# Patient Record
Sex: Male | Born: 1935 | Race: White | Hispanic: No | Marital: Married | Smoking: Never smoker
Health system: Southern US, Community
[De-identification: ages and names within clinical notes are randomized; demographics above are authoritative.]

## PROBLEM LIST (undated history)

## (undated) DIAGNOSIS — I219 Acute myocardial infarction, unspecified: Secondary | ICD-10-CM

## (undated) DIAGNOSIS — D638 Anemia in other chronic diseases classified elsewhere: Secondary | ICD-10-CM

## (undated) DIAGNOSIS — E079 Disorder of thyroid, unspecified: Secondary | ICD-10-CM

## (undated) DIAGNOSIS — C801 Malignant (primary) neoplasm, unspecified: Secondary | ICD-10-CM

## (undated) DIAGNOSIS — I10 Essential (primary) hypertension: Secondary | ICD-10-CM

## (undated) DIAGNOSIS — K2970 Gastritis, unspecified, without bleeding: Secondary | ICD-10-CM

## (undated) DIAGNOSIS — E119 Type 2 diabetes mellitus without complications: Secondary | ICD-10-CM

## (undated) DIAGNOSIS — F0280 Dementia in other diseases classified elsewhere without behavioral disturbance: Secondary | ICD-10-CM

## (undated) DIAGNOSIS — E785 Hyperlipidemia, unspecified: Secondary | ICD-10-CM

## (undated) DIAGNOSIS — H269 Unspecified cataract: Secondary | ICD-10-CM

## (undated) DIAGNOSIS — G309 Alzheimer's disease, unspecified: Secondary | ICD-10-CM

## (undated) DIAGNOSIS — N183 Chronic kidney disease, stage 3 (moderate): Secondary | ICD-10-CM

## (undated) HISTORY — PX: APPENDECTOMY: SHX54

## (undated) HISTORY — DX: Essential (primary) hypertension: I10

## (undated) HISTORY — DX: Malignant (primary) neoplasm, unspecified: C80.1

## (undated) HISTORY — DX: Hyperlipidemia, unspecified: E78.5

## (undated) HISTORY — DX: Unspecified cataract: H26.9

## (undated) HISTORY — DX: Type 2 diabetes mellitus without complications: E11.9

## (undated) HISTORY — DX: Anemia in other chronic diseases classified elsewhere: D63.8

## (undated) HISTORY — DX: Disorder of thyroid, unspecified: E07.9

## (undated) HISTORY — DX: Acute myocardial infarction, unspecified: I21.9

## (undated) HISTORY — PX: EYE SURGERY: SHX253

## (undated) HISTORY — DX: Dementia in other diseases classified elsewhere, unspecified severity, without behavioral disturbance, psychotic disturbance, mood disturbance, and anxiety: F02.80

## (undated) HISTORY — DX: Chronic kidney disease, stage 3 (moderate): N18.3

## (undated) HISTORY — DX: Alzheimer's disease, unspecified: G30.9

---

## 2017-03-17 ENCOUNTER — Encounter: Payer: Self-pay | Admitting: Family Medicine

## 2017-03-17 ENCOUNTER — Ambulatory Visit (INDEPENDENT_AMBULATORY_CARE_PROVIDER_SITE_OTHER): Payer: Medicare Other | Admitting: Family Medicine

## 2017-03-17 ENCOUNTER — Encounter: Payer: Medicare Other | Admitting: Family Medicine

## 2017-03-17 ENCOUNTER — Other Ambulatory Visit: Payer: Self-pay

## 2017-03-17 VITALS — BP 140/68 | HR 94 | Temp 97.4°F | Resp 14 | Ht 63.0 in | Wt 144.8 lb

## 2017-03-17 DIAGNOSIS — G301 Alzheimer's disease with late onset: Secondary | ICD-10-CM

## 2017-03-17 DIAGNOSIS — E559 Vitamin D deficiency, unspecified: Secondary | ICD-10-CM

## 2017-03-17 DIAGNOSIS — F0280 Dementia in other diseases classified elsewhere without behavioral disturbance: Secondary | ICD-10-CM | POA: Diagnosis not present

## 2017-03-17 DIAGNOSIS — C44300 Unspecified malignant neoplasm of skin of unspecified part of face: Secondary | ICD-10-CM | POA: Diagnosis not present

## 2017-03-17 DIAGNOSIS — E785 Hyperlipidemia, unspecified: Secondary | ICD-10-CM

## 2017-03-17 DIAGNOSIS — N183 Chronic kidney disease, stage 3 unspecified: Secondary | ICD-10-CM

## 2017-03-17 DIAGNOSIS — E039 Hypothyroidism, unspecified: Secondary | ICD-10-CM | POA: Diagnosis not present

## 2017-03-17 DIAGNOSIS — E119 Type 2 diabetes mellitus without complications: Secondary | ICD-10-CM | POA: Diagnosis not present

## 2017-03-17 DIAGNOSIS — R269 Unspecified abnormalities of gait and mobility: Secondary | ICD-10-CM

## 2017-03-17 DIAGNOSIS — R296 Repeated falls: Secondary | ICD-10-CM

## 2017-03-17 DIAGNOSIS — IMO0002 Reserved for concepts with insufficient information to code with codable children: Secondary | ICD-10-CM

## 2017-03-17 DIAGNOSIS — D638 Anemia in other chronic diseases classified elsewhere: Secondary | ICD-10-CM | POA: Diagnosis not present

## 2017-03-17 DIAGNOSIS — E538 Deficiency of other specified B group vitamins: Secondary | ICD-10-CM | POA: Diagnosis not present

## 2017-03-17 DIAGNOSIS — R808 Other proteinuria: Secondary | ICD-10-CM

## 2017-03-17 DIAGNOSIS — G309 Alzheimer's disease, unspecified: Secondary | ICD-10-CM

## 2017-03-17 DIAGNOSIS — I10 Essential (primary) hypertension: Secondary | ICD-10-CM | POA: Insufficient documentation

## 2017-03-17 DIAGNOSIS — E1165 Type 2 diabetes mellitus with hyperglycemia: Secondary | ICD-10-CM

## 2017-03-17 DIAGNOSIS — E118 Type 2 diabetes mellitus with unspecified complications: Secondary | ICD-10-CM

## 2017-03-17 LAB — GLUCOSE, POCT (MANUAL RESULT ENTRY): POC Glucose: 267 mg/dl — AB (ref 70–99)

## 2017-03-17 NOTE — Patient Instructions (Addendum)
Please look at the new medication list I have recommended discontinuing several medicines. Baby aspirin needs to be taken every day. Do not start the Lasix at this time Do not need vitamin E or fenofibrate  Need lab work today I will notify you of your lab test results I may recommend other medication changes once I have seen the test results  Have placed an urgent referral to dermatology I will place referrals to cardiology or endocrinology depending on his test results and need  I recommended that Jonathon Miller not drive a car for the time being.  Daily walking, with supervision, to tolerance is important to keep leg strength  Return in 1-2 weeks for follow-up with Jonathon Miller (40 minutes)  Need old records from [place] [facility] and endocrinology. (Last 2 years only)  Give Imodium AD over-the-counter 1 pill every morning.  Give a second pill if the diarrhea persists longer than 1 hour.  I recommend getting a fresh bottle. Drink plenty of water.

## 2017-03-17 NOTE — Progress Notes (Addendum)
Chief Complaint  Patient presents with  . Diarrhea  . Diabetes  . Fall   This is a lovely 84 year old man who is brought in by his daughter.  He and his wife [name] have just moved to the area from [place] 1 week ago.  They moved here because [name] was unable to continue to care for him with advancing dementia.  He also has some weakness, gait disturbance, and falls. History is difficult because Dreshaun is both hard of hearing and has moderate dementia.  His wife does not seem to know all the answers.  The daughter has just gotten them 1 week ago and will help fill in some.  I am going to have to get the old records to complete his chart.  His medical problem list was made from the medicine box they brought with them. Rameses has had a gradual memory loss over many years.  A week ago, in [place], he was still driving.  He knew the roads, went short distances, and felt comfortable doing.  His daughter Juliann Pulse was not comfortable.  After MMSE testing today, with a score of 19, I informed Levy that I do not think he should be driving.  Especially since they moved to a new area he does not know his way around.  He has not had any memory testing, neurology consultation, or medicines for Alzheimer's. He has diabetes.  Well controlled by history.  He takes oral medications.  It is questionable if he has been taking his medicines correctly since he has been responsible for setting up his pillbox and taking his medicines until very recently.  He sometimes forgets if he had his pills and mixes up his pills in the pill box.  No known visual impairment, kidney disease, neuropathy or numbness. He has hyperlipidemia and is on a statin. He has hypertension and is on atenolol.  Uncertain why he is not on an ACE or ARB medication. He states that he had a heart attack in 1979.  He has not had one since.  He is uncertain if he is been followed by cardiologist.  Uncertain cardiology testing.  He is uncertain if he is  ever had stents.  He is certain he has not had any heart surgery. He thinks he has had the pneumonia shots, uncertain when.  He states he always gets yearly flu shot.  Shingles shot "sounds familiar".  Unknown last tetanus. As I interview him I realize he has a lesion on his lip.  It is obviously a basal cell carcinoma.  She states this was diagnosed as a cancer in [place], and they were advised to get it fixed up here.  Patient Active Problem List   Diagnosis Date Noted  . Diabetes mellitus without complication ([place]) 81/01/7508  . Alzheimer's dementia without behavioral disturbance 03/17/2017  . Hypothyroidism 03/17/2017  . Hypertension 03/17/2017  . Hyperlipidemia 03/17/2017  . Gait disturbance 03/17/2017  . Falls frequently 03/17/2017  . Vitamin D deficiency, unspecified 03/17/2017  . Vitamin B12 deficiency (non anemic) 03/17/2017    Outpatient Encounter Medications as of 03/17/2017  Medication Sig  . atenolol (TENORMIN) 50 MG tablet Take 50 mg by mouth daily.  [name] glipiZIDE (GLUCOTROL) 5 MG tablet Take by mouth daily before breakfast.  . levothyroxine (SYNTHROID) 50 MCG tablet Take 50 mcg by mouth daily before breakfast.  . linagliptin (TRADJENTA) 5 MG TABS tablet Take 5 mg by mouth daily.  . nitroGLYCERIN (NITROSTAT) 0.4 MG SL tablet Place 0.4 mg under  the tongue every 5 (five) minutes as needed for chest pain.  . pioglitazone (ACTOS) 15 MG tablet Take 15 mg by mouth daily.  . simvastatin (ZOCOR) 40 MG tablet Take 40 mg by mouth daily.   No facility-administered encounter medications on file as of 03/17/2017.     Past Medical History:  Diagnosis Date  . Alzheimer's dementia   . Cancer (HCC)    Skin  . Cataract    skin  . Diabetes mellitus without complication ([place])   . Hyperlipidemia   . Hypertension   . Myocardial infarction ([place])   . Thyroid disease     Past Surgical History:  Procedure Laterality Date  . APPENDECTOMY    . EYE SURGERY      Social History    Socioeconomic History  . Marital status: Married    Spouse name: [name]  . Number of children: 3  . Years of education: 70  . Highest education level: High school graduate  Social Needs  . Financial resource strain: Not hard at all  . Food insecurity - worry: Never true  . Food insecurity - inability: Never true  . Transportation needs - medical: No  . Transportation needs - non-medical: No  Occupational History  . 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 Occupation: Child psychotherapist  . 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 Smoking status: Never Smoker  . Smokeless tobacco: Never Used  Substance and Sexual Activity  . Alcohol use: No    Frequency: Never  . Drug use: No  . Sexual activity: No  Other Topics Concern  . Not on file  Social History Narrative   Just moved to [place] with wife [name], wife of 24 years (second)   Lives with Daughter [name] who helps care for them   Likes to Golf-not able anymore   Deer Hunting-not able anymore   Prior occupation was baking/cooking    Family History  Problem Relation Age of Onset  . Diabetes Other     Review of Systems  Constitutional: Negative for chills, fever, malaise/fatigue and weight loss.       More difficulty with strength in legs  HENT: Positive for hearing loss. Negative for congestion and sinus pain.        Did not bring his hearing aids, and obviously hard of hearing  Eyes: Negative for blurred vision and double vision.  Respiratory: Negative for cough and shortness of breath.   Cardiovascular: Negative for chest pain, palpitations and leg swelling.       Carries nitro that he never uses  Gastrointestinal: Positive for diarrhea. Negative for abdominal pain and blood in stool.       Chronic diarrhea for many years.  "Runs in family".  Worse since moving to [place].  Genitourinary: Negative for dysuria and frequency.  Musculoskeletal: Positive for falls. Negative for joint pain and myalgias.   Skin:       Lesion on upper lip  Neurological: Positive for weakness and headaches. Negative for dizziness.  Psychiatric/Behavioral: Positive for depression and memory loss.    BP 140/68 (BP Location: Right Arm, Patient Position: Sitting, Cuff Size: Normal)   Pulse 94   Temp (!) 97.4 F (36.3 C)   Resp 14   Ht 5\' 3"  (1.6 m)   Wt 144 lb 12 oz (65.7 kg)   SpO2 97%   BMI 25.64 kg/m   Physical Exam  Constitutional: He is oriented to person, place, and time.  He appears well-developed and well-nourished. No distress.  Small steps.  Requires assistance getting out of chair.  Person assist to walk  HENT:  Head: Normocephalic and atraumatic.  Right Ear: External ear normal.  Left Ear: External ear normal.  Mouth/Throat: Oropharynx is clear and moist.    Well hydrated.  Dentures fit  Eyes: Pupils are equal, round, and reactive to light.  Arcus senilis  Neck: Normal range of motion. Neck supple. No JVD present. No thyromegaly present.  No bruit  Cardiovascular: Normal rate, regular rhythm and normal heart sounds.  Pulmonary/Chest: Effort normal and breath sounds normal. He has no wheezes. He has no rales.  Abdominal: Soft. Bowel sounds are normal. He exhibits no distension.  No organomegaly.  Normal bowel sounds.  Nontender.  No guarding rebound  Musculoskeletal: Normal range of motion. He exhibits no edema.  Diminished strength quadriceps and hip flexors.  Otherwise normal strength sensation range of motion and reflexes  Neurological: He is alert and oriented to person, place, and time. He displays normal reflexes. Coordination abnormal.  Skin: Skin is warm and dry.  Psychiatric:  Quiet demeanor.  Likes to joke.  Obvious memory impairment.Normal mood and affect.  Vitals reviewed.  MMSE - Mini Mental State Exam 03/17/2017  Orientation to time 3  Orientation to Place 2  Registration 1  Attention/ Calculation 5  Recall 0  Language- name 2 objects 2  Language- repeat 1   Language- follow 3 step command 3  Language- read & follow direction 1  Write a sentence 1  Copy design 0  Total score 19   Results for orders placed or performed in visit on 03/17/17  POCT Glucose (CBG)  Result Value Ref Range   POC Glucose 267 (A) 70 - 99 mg/dl   ASSESSMENT/PLAN:   1. Gait disturbance Recent per family over the last couple of months.  Increased difficulty with ambulation, requiring assistance.  Trouble getting out of chair.  2. Falls frequently No falls [place] but fell in [place] several times.  No injuries.  Patient is certain he has not fainted, he says he just loses his balance.  3. Essential hypertension Well-controlled.  Would likely change him to an ACE or ARB  4. Hypothyroidism, unspecified type On replacement - TSH  5. Diabetes mellitus without complication ([place]) Well-controlled by history.  No complications by history.  Need updated blood work and old records. - CBC with Differential/Platelet - COMPLETE METABOLIC PANEL WITH GFR - Hemoglobin A1c - Urinalysis, Routine w reflex microscopic - Microalbumin / creatinine urine ratio  6. Late onset Alzheimer's disease without behavioral disturbance With an MMSE of 19 I recommend that he not drive.  I recommend that the daughter attend Alzheimer's caregiver classes.  We need to understand that he does not make new memories while on that moving is going to be difficult for him.  The need to take over his medication distribution.  I have discontinued several medicines I do not feel like he needs.  Daily exercise, to his tolerance, is advisable.  7. Hyperlipidemia, unspecified hyperlipidemia type On medication  - Lipid panel  8. Vitamin D deficiency, unspecified By history - VITAMIN D 25 Hydroxy (Vit-D Deficiency, Fractures)  9. Vitamin B12 deficiency (non anemic) By history - Vitamin B12  10. Cancer of skin of face Referred - Ambulatory referral to Dermatology   Patient Instructions   Please look at the new medication list I have recommended discontinuing several medicines. Baby aspirin needs to be taken every  day. Do not start the Lasix at this time Do not need vitamin E or fenofibrate  Need lab work today I will notify you of your lab test results I may recommend other medication changes once I have seen the test results  Have placed an urgent referral to dermatology I will place referrals to cardiology or endocrinology depending on his test results and need  I recommended that Skyy not drive a car for the time being.  Daily walking, with supervision, to tolerance is important to keep leg strength  Return in 1-2 weeks for follow-up with Dr. Mannie Stabile (40 minutes)  Need old records from [place] [facility] and endocr[facility]ology. (Last 2 years only)  Give Imodium AD over-the-counter 1 pill every morning.  Give a second pill if the diarrhea persists longer than 1 hour.  I recommend getting a fresh bottle. Drink plenty of water.   Raylene Everts, MD

## 2017-03-18 ENCOUNTER — Encounter: Payer: Self-pay | Admitting: Family Medicine

## 2017-03-18 DIAGNOSIS — E1122 Type 2 diabetes mellitus with diabetic chronic kidney disease: Secondary | ICD-10-CM | POA: Insufficient documentation

## 2017-03-18 DIAGNOSIS — D638 Anemia in other chronic diseases classified elsewhere: Secondary | ICD-10-CM | POA: Insufficient documentation

## 2017-03-18 DIAGNOSIS — N183 Chronic kidney disease, stage 3 unspecified: Secondary | ICD-10-CM | POA: Insufficient documentation

## 2017-03-18 DIAGNOSIS — R809 Proteinuria, unspecified: Secondary | ICD-10-CM | POA: Insufficient documentation

## 2017-03-18 HISTORY — DX: Anemia in other chronic diseases classified elsewhere: D63.8

## 2017-03-18 HISTORY — DX: Chronic kidney disease, stage 3 unspecified: N18.30

## 2017-03-18 LAB — LIPID PANEL
CHOL/HDL RATIO: 4.4 (calc) (ref <5.0)
Cholesterol: 179 mg/dL (ref <200)
HDL: 41 mg/dL (ref >40)
LDL CHOLESTEROL (CALC): 107 mg/dL — AB
NON-HDL CHOLESTEROL (CALC): 138 mg/dL — AB (ref <130)
TRIGLYCERIDES: 185 mg/dL — AB (ref <150)

## 2017-03-18 LAB — CBC WITH DIFFERENTIAL/PLATELET
BASOS ABS: 29 {cells}/uL (ref 0–200)
Basophils Relative: 0.3 %
Eosinophils Absolute: 10 cells/uL — ABNORMAL LOW (ref 15–500)
Eosinophils Relative: 0.1 %
HEMATOCRIT: 33.9 % — AB (ref 38.5–50.0)
Hemoglobin: 11.5 g/dL — ABNORMAL LOW (ref 13.2–17.1)
Lymphs Abs: 686 cells/uL — ABNORMAL LOW (ref 850–3900)
MCH: 29.9 pg (ref 27.0–33.0)
MCHC: 33.9 g/dL (ref 32.0–36.0)
MCV: 88.1 fL (ref 80.0–100.0)
MONOS PCT: 3.4 %
MPV: 10.4 fL (ref 7.5–12.5)
NEUTROS PCT: 89.2 %
Neutro Abs: 8742 cells/uL — ABNORMAL HIGH (ref 1500–7800)
PLATELETS: 235 10*3/uL (ref 140–400)
RBC: 3.85 10*6/uL — ABNORMAL LOW (ref 4.20–5.80)
RDW: 12.7 % (ref 11.0–15.0)
TOTAL LYMPHOCYTE: 7 %
WBC mixed population: 333 cells/uL (ref 200–950)
WBC: 9.8 10*3/uL (ref 3.8–10.8)

## 2017-03-18 LAB — COMPLETE METABOLIC PANEL WITH GFR
AG RATIO: 1.9 (calc) (ref 1.0–2.5)
ALT: 14 U/L (ref 9–46)
AST: 19 U/L (ref 10–35)
Albumin: 4.4 g/dL (ref 3.6–5.1)
Alkaline phosphatase (APISO): 50 U/L (ref 40–115)
BILIRUBIN TOTAL: 0.4 mg/dL (ref 0.2–1.2)
BUN/Creatinine Ratio: 18 (calc) (ref 6–22)
BUN: 29 mg/dL — ABNORMAL HIGH (ref 7–25)
CHLORIDE: 99 mmol/L (ref 98–110)
CO2: 27 mmol/L (ref 20–32)
Calcium: 9.7 mg/dL (ref 8.6–10.3)
Creat: 1.59 mg/dL — ABNORMAL HIGH (ref 0.70–1.11)
GFR, EST AFRICAN AMERICAN: 44 mL/min/{1.73_m2} — AB (ref >=60)
GFR, Est Non African American: 38 mL/min/{1.73_m2} — ABNORMAL LOW (ref >=60)
GLOBULIN: 2.3 g/dL (ref 1.9–3.7)
Glucose, Bld: 296 mg/dL — ABNORMAL HIGH (ref 65–139)
POTASSIUM: 5.1 mmol/L (ref 3.5–5.3)
SODIUM: 135 mmol/L (ref 135–146)
TOTAL PROTEIN: 6.7 g/dL (ref 6.1–8.1)

## 2017-03-18 LAB — URINALYSIS, ROUTINE W REFLEX MICROSCOPIC
BACTERIA UA: NONE SEEN /HPF
Bilirubin Urine: NEGATIVE
HYALINE CAST: NONE SEEN /LPF
Hgb urine dipstick: NEGATIVE
Ketones, ur: NEGATIVE
Leukocytes, UA: NEGATIVE
Nitrite: NEGATIVE
RBC / HPF: NONE SEEN /HPF (ref 0–2)
SPECIFIC GRAVITY, URINE: 1.023 (ref 1.001–1.03)
Squamous Epithelial / LPF: NONE SEEN /HPF (ref <=5)
WBC UA: NONE SEEN /HPF (ref 0–5)

## 2017-03-18 LAB — MICROALBUMIN / CREATININE URINE RATIO
Creatinine, Urine: 84 mg/dL (ref 20–320)
MICROALB UR: 22.1 mg/dL
MICROALB/CREAT RATIO: 263 ug/mg{creat} — AB (ref <30)

## 2017-03-18 LAB — HEMOGLOBIN A1C
EAG (MMOL/L): 12.5 (calc)
Hgb A1c MFr Bld: 9.5 % of total Hgb — ABNORMAL HIGH (ref <5.7)
Mean Plasma Glucose: 226 (calc)

## 2017-03-18 LAB — TSH: TSH: 1.76 m[IU]/L (ref 0.40–4.50)

## 2017-03-18 LAB — VITAMIN B12: VITAMIN B 12: 503 pg/mL (ref 200–1100)

## 2017-03-18 LAB — VITAMIN D 25 HYDROXY (VIT D DEFICIENCY, FRACTURES): VIT D 25 HYDROXY: 50 ng/mL (ref 30–100)

## 2017-03-19 ENCOUNTER — Encounter: Payer: Self-pay | Admitting: Family Medicine

## 2017-03-30 ENCOUNTER — Ambulatory Visit (INDEPENDENT_AMBULATORY_CARE_PROVIDER_SITE_OTHER): Payer: Medicare Other | Admitting: Family Medicine

## 2017-03-30 ENCOUNTER — Encounter: Payer: Self-pay | Admitting: Family Medicine

## 2017-03-30 ENCOUNTER — Other Ambulatory Visit: Payer: Self-pay

## 2017-03-30 VITALS — BP 114/60 | HR 50 | Temp 97.9°F | Resp 16 | Ht 63.0 in | Wt 147.0 lb

## 2017-03-30 DIAGNOSIS — IMO0002 Reserved for concepts with insufficient information to code with codable children: Secondary | ICD-10-CM

## 2017-03-30 DIAGNOSIS — E785 Hyperlipidemia, unspecified: Secondary | ICD-10-CM | POA: Diagnosis not present

## 2017-03-30 DIAGNOSIS — I10 Essential (primary) hypertension: Secondary | ICD-10-CM | POA: Diagnosis not present

## 2017-03-30 DIAGNOSIS — E118 Type 2 diabetes mellitus with unspecified complications: Secondary | ICD-10-CM

## 2017-03-30 DIAGNOSIS — E1165 Type 2 diabetes mellitus with hyperglycemia: Secondary | ICD-10-CM

## 2017-03-30 DIAGNOSIS — R001 Bradycardia, unspecified: Secondary | ICD-10-CM

## 2017-03-30 DIAGNOSIS — H6122 Impacted cerumen, left ear: Secondary | ICD-10-CM | POA: Diagnosis not present

## 2017-03-30 DIAGNOSIS — F0280 Dementia in other diseases classified elsewhere without behavioral disturbance: Secondary | ICD-10-CM | POA: Diagnosis not present

## 2017-03-30 DIAGNOSIS — C4491 Basal cell carcinoma of skin, unspecified: Secondary | ICD-10-CM | POA: Diagnosis not present

## 2017-03-30 DIAGNOSIS — R5383 Other fatigue: Secondary | ICD-10-CM

## 2017-03-30 DIAGNOSIS — G301 Alzheimer's disease with late onset: Secondary | ICD-10-CM | POA: Diagnosis not present

## 2017-03-30 DIAGNOSIS — R2681 Unsteadiness on feet: Secondary | ICD-10-CM

## 2017-03-30 DIAGNOSIS — R269 Unspecified abnormalities of gait and mobility: Secondary | ICD-10-CM | POA: Diagnosis not present

## 2017-03-30 NOTE — Progress Notes (Signed)
Patient ID: Jonathon Miller, male    DOB: 22-Dec-1925, 84 y.o.   MRN: [id number]  Chief Complaint  Patient presents with  . Follow-up    Allergies Penicillins  Subjective:   Jonathon Miller is a 84 y.o. male who presents to [facility] today.  HPI Mr. Hinde is an 84 year old elderly gentleman who presents today with his wife and stepdaughter for office visit to establish care.  He moved from [place] approximately 1 month ago with his wife.  He has a history of heart attack, hypothyroidism, diabetes mellitus type 2, and dementia.  Has had issues with his memory and given the diagnosis of dementia greater than 5 years ago.  He had requested to not be on any medications and he did not want evaluation of his dementia in the past.  He has not had any imaging of his brain or formal evaluation of his memory.  He deferred medications because he did not trust the side effects or trust the medications.  He does continue to have a slow decline in his memory and no acute changes.  He was seen by Dr. Meda Coffee less than a month ago and his Mini-Mental status test was 19 out of 30.  He is no longer driving.  He functions well at home and taking care of his ADLs.  He has not had any recent falls since moving to [place].   In February, 2019 prior to his move from [place] he was seen by dermatologist for a lesion on the skin above the border of his right upper lip.  They bring in the pathology today from the shave biopsy which revealed a nodular basal cell carcinoma.  It was recommended that he follow-up with a dermatologist in this area for Mohs surgery.  They are requesting a referral.  His daughter and wife report that they are a little bit concerned because he seems more fatigued than usual over the past several weeks.  They report that he sleeps on and often naps throughout the day but still does sleep well at night.  He reports that he is not down, depressed, or hopeless.  He does  report some feelings of sadness with leaving [place] and his golfing friends.  He did not golf but still hung out and socialized with this group of friends.  His appetite is good.  Is not crying.  Denies any suicidal or homicidal ideations.  Has not been able to get outside and be as active because the weather has been poor for the past month since moving here.  Is planning on having a garden soon.  He has not had any falls since moving here but reports a history of an unsteady gait with several falls in [place].  He does walk the dog on a daily basis.  He has not had any acute changes in his gait.  He does not do any other daily exercise or muscle strengthening other than walking the dog if the weather is good.  It is a small dog and if there is never been any associated falls with this exercise.  Family is inquiring about ways to help with his balance.  He does wear hearing aids due to hearing loss.  He does wear the hearing aids at times when he walks the dog but not every time.  Denies any nausea, vomiting, difficulty urinating.  Reports that 2 weeks ago had headaches for several days which was abnormal, but has not had any subsequent  headaches.  Vision is stable.  Medications have previously been administered by patient and his wife.  Daughter recently took over administering medications.  When he was last seen in our office on February 20/2019 by Dr. Meda Coffee his hemoglobin A1c was elevated greater than 9%, TSH was within normal limits, electrolytes were normal,  LDL was greater than 100, creatinine was consistent with chronic renal insufficiency.  Urinalysis did reveal proteinuria.  Since his last visit and this information was relayed to his daughter.  She reports that he has been getting his medications on a daily basis.  He did take his atenolol this morning.    Past Medical History:  Diagnosis Date  . Alzheimer's dementia   . Anemia of chronic disease 03/18/2017  . Cancer (HCC)    Skin  .  Cataract    skin  . CRD (chronic renal disease), stage III ([place]) 03/18/2017  . Diabetes mellitus without complication ([place])   . Hyperlipidemia   . Hypertension   . Myocardial infarction ([place])   . Thyroid disease     Past Surgical History:  Procedure Laterality Date  . APPENDECTOMY    . EYE SURGERY      Family History  Problem Relation Age of Onset  . Diabetes Other      Social History   Socioeconomic History  . Marital status: Married    Spouse name: [name]  . Number of children: 3  . Years of education: 29  . Highest education level: High school graduate  Social Needs  . Financial resource strain: Not hard at all  . Food insecurity - worry: Never true  . Food insecurity - inability: Never true  . Transportation needs - medical: No  . Transportation needs - non-medical: No  Occupational History  . 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 Occupation: Child psychotherapist  . 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 Smoking status: Never Smoker  . Smokeless tobacco: Never Used  Substance and Sexual Activity  . Alcohol use: No    Frequency: Never  . Drug use: No  . Sexual activity: No  Other Topics Concern  . None  Social History Narrative   Just moved to [place] with wife [name], wife of 24 years (second)   Lives with Daughter [name] who helps care for them   Likes to Golf-not able anymore   Deer Hunting-not able anymore   Prior occupation was baking/cooking      Current Outpatient Medications on File Prior to Visit  Medication Sig Dispense Refill  . aspirin EC 81 MG tablet Take 81 mg by mouth daily.    [name] glipiZIDE (GLUCOTROL) 5 MG tablet Take by mouth daily before breakfast.    . levothyroxine (SYNTHROID) 50 MCG tablet Take 50 mcg by mouth daily before breakfast.    . linagliptin (TRADJENTA) 5 MG TABS tablet Take 5 mg by mouth daily.    . nitroGLYCERIN (NITROSTAT) 0.4 MG SL tablet Place 0.4 mg under the tongue every 5 (five) minutes as needed for chest  pain.    . pioglitazone (ACTOS) 15 MG tablet Take 15 mg by mouth daily.    . vitamin C (ASCORBIC ACID) 500 MG tablet Take 500 mg by mouth daily.     No current facility-administered medications on file prior to visit.     Review of Systems  Constitutional: Positive for fatigue. Negative for appetite change, chills, diaphoresis and unexpected weight change.  HENT: Positive for hearing loss. Negative  for sore throat, tinnitus, trouble swallowing and voice change.        Feels like over the past several weeks that his hearing is gotten worse in his left ear.  Of note he is not wearing the hearing aid in his left ear today.  Eyes: Negative for visual disturbance.  Respiratory: Negative for cough, chest tightness, shortness of breath and wheezing.   Cardiovascular: Negative for chest pain, palpitations and leg swelling.  Gastrointestinal: Negative for abdominal pain, rectal pain and vomiting.       Patient and family report that he has episodes of diarrhea long-term.  They report that for years he will have intermittent episodes of diarrhea which can occur every several days.  Denies any melena, bright red blood per rectum.  Denies any abdominal pain.  Reports that the diarrhea/loose her stools are his "regular" bowel movements.  Genitourinary: Negative for decreased urine volume, difficulty urinating, dysuria and hematuria.  Musculoskeletal: Negative for back pain, myalgias, neck pain and neck stiffness.  Skin: Negative for rash.  Neurological: Negative for dizziness, tremors, syncope, light-headedness, numbness and headaches.       He specifically denies symptoms of dizziness or vertigo.  He reports that he just does not feel as steady on his feet and like he might fall.  Hematological: Negative for adenopathy. Does not bruise/bleed easily.  Psychiatric/Behavioral: Positive for sleep disturbance. Negative for agitation, behavioral problems, decreased concentration and dysphoric mood. The patient  is not nervous/anxious.      Objective:   BP 114/60 (BP Location: Left Arm, Patient Position: Sitting, Cuff Size: Normal)   Pulse (!) 50   Temp 97.9 F (36.6 C) (Temporal)   Resp 16   Ht 5\' 3"  (1.6 m)   Wt 147 lb (66.7 kg)   SpO2 98%   BMI 26.04 kg/m  Pulse recheck 46 Physical Exam  Constitutional: He appears well-developed and well-nourished. No distress.  Well-developed, well-nourished elderly gentleman.  Appearance consistent with his stated age.  Alert.  Very good at deflecting questions to which require attention to the past or remote memory.  HENT:  Head: Normocephalic and atraumatic.  Right Ear: External ear normal.  Nose: Nose normal.  Mouth/Throat: Oropharynx is clear and moist. No oropharyngeal exudate.  Left tympanic membrane unable to be visualized secondary to cerumen obstruction.  Eyes: Conjunctivae and EOM are normal. Pupils are equal, round, and reactive to light. No scleral icterus.  Neck: Normal range of motion. Neck supple. No JVD present. No tracheal deviation present. No thyromegaly present.  Cardiovascular: Regular rhythm and normal heart sounds. Bradycardia present.  Pulmonary/Chest: Effort normal and breath sounds normal. No respiratory distress.  Abdominal: Soft. Bowel sounds are normal. He exhibits no distension. There is no tenderness.  Lymphadenopathy:    He has no cervical adenopathy.  Neurological: He is alert. He has normal strength. No cranial nerve deficit or sensory deficit. Gait normal.  Gait appears normal.  No wide-based stance or abnormalities.  Muscle strength 5 out of 5 throughout.  Grip strength strong.  Patient does have some difficulty giving straightforward answers to questions and review of systems.  He is unable to tell me if he has muscle aches or pains.  Skin: He is not diaphoretic.     Assessment and Plan  1. Impacted cerumen of left ear Refer to ENT for cerumen removal/follow-up audiology evaluation.  Defer ear lavage  secondary to patient's gait instability and fall risk.  Believe that this could further exacerbate his condition.  Need  to establish with ENT secondary to hearing loss anyway. - Ambulatory referral to ENT  2. Nodular basal cell carcinoma Referral placed to dermatology for surgical removal of basal cell. - Ambulatory referral to Dermatology  3. Gait instability Refer to physical therapy for evaluation and treatment to assist with balance therapy/gait stability. - Ambulatory referral to Physical Therapy  4. Hyperlipidemia, unspecified hyperlipidemia type Discontinue medication at this time secondary to side effects and patient does not want to take any additional medication that is  Not absolutely required. Family wishes to d/c at this time the statin. Understands risk vs. Benefit of medication.   5. Gait disturbance See above.  6. Late onset Alzheimer's disease without behavioral disturbance At this time, labs reviewed and discussed with family.  Defer any imaging test or further workup at this time.  Continue to assist and support as needed.  7. Uncontrolled type 2 diabetes mellitus with complication (HCC) Continue medications as directed.  Did discuss with daughter that I do not recommend increasing medications at this time due to the fact that he has not been compliant in the past.  They can continue to monitor sporadic blood sugars at home.  They report they have been checking fasting levels for years and they have come down over the past several weeks.  Continue dietary monitoring.  Recommend bring diet record for review.  Plan to recheck A1c in 3 months.  8. Essential hypertension/now with symptoms of bradycardia. Unsure if patient is on beta-blocker secondary to history of hypertension versus secondary to acute myocardial infarction.  Patient current with bradycardia.  DC atenolol at this time. Discussed with daughter and patient today that his pulse was 46.  Will DC atenolol at this  time and they will call back tomorrow with his pulse reading and depending reading will further dictate management. 9.  Hypothyroidism. Continue medication.  TSH within normal limits.  10.   Fatigue Fatigue possibly due to multiple etiologies.  At this time suspect fatigue secondary to bradycardia.  Discussed with family and patient that he could have some mild mood disorder at this time secondary to moving that could be causing his fatigue.  Defer medications at this time.  He does not have overt signs of depression.  Fatigue could be secondary to anemia, but I suspect that this is been a chronic process related to his chronic disease.  Possibly check ferritin levels and erythropoietin levels at follow-up.  Need to discuss with family and patient the degree to which they would like to investigate and evaluate these conditions. Return in about 2 weeks (around 04/13/2017) for Heart rate. [name], MD 03/30/2017

## 2017-04-01 ENCOUNTER — Telehealth: Payer: Self-pay | Admitting: Family Medicine

## 2017-04-01 NOTE — Telephone Encounter (Signed)
Please call and advise to still hold the medication and let me know on Monday how his pulse runs over the weekend.  Gwen Her. Mannie Stabile, MD

## 2017-04-01 NOTE — Telephone Encounter (Signed)
Daughter, Lorenza Evangelist, left message on nurse line stating patient's pulse yesterday was 74 and 54 today

## 2017-04-01 NOTE — Telephone Encounter (Signed)
Spoke to daughter, informed

## 2017-04-15 ENCOUNTER — Telehealth: Payer: Self-pay | Admitting: Family Medicine

## 2017-04-21 ENCOUNTER — Telehealth (HOSPITAL_COMMUNITY): Payer: Self-pay | Admitting: Family Medicine

## 2017-04-21 ENCOUNTER — Encounter: Payer: Self-pay | Admitting: Family Medicine

## 2017-04-21 ENCOUNTER — Other Ambulatory Visit: Payer: Self-pay

## 2017-04-21 ENCOUNTER — Ambulatory Visit (INDEPENDENT_AMBULATORY_CARE_PROVIDER_SITE_OTHER): Payer: Medicare Other | Admitting: Family Medicine

## 2017-04-21 VITALS — BP 132/64 | HR 93 | Temp 97.1°F | Resp 16 | Ht 63.0 in | Wt 146.8 lb

## 2017-04-21 DIAGNOSIS — R21 Rash and other nonspecific skin eruption: Secondary | ICD-10-CM | POA: Diagnosis not present

## 2017-04-21 DIAGNOSIS — R001 Bradycardia, unspecified: Secondary | ICD-10-CM | POA: Diagnosis not present

## 2017-04-21 DIAGNOSIS — F0390 Unspecified dementia without behavioral disturbance: Secondary | ICD-10-CM

## 2017-04-21 DIAGNOSIS — R2681 Unsteadiness on feet: Secondary | ICD-10-CM | POA: Diagnosis not present

## 2017-04-21 DIAGNOSIS — G3281 Cerebellar ataxia in diseases classified elsewhere: Secondary | ICD-10-CM | POA: Diagnosis not present

## 2017-04-21 DIAGNOSIS — G309 Alzheimer's disease, unspecified: Secondary | ICD-10-CM | POA: Diagnosis not present

## 2017-04-21 DIAGNOSIS — L821 Other seborrheic keratosis: Secondary | ICD-10-CM | POA: Diagnosis not present

## 2017-04-21 DIAGNOSIS — Z23 Encounter for immunization: Secondary | ICD-10-CM

## 2017-04-21 NOTE — Telephone Encounter (Signed)
04/21/17  wife left a message to [facility] said that patient would be receiving treatment in the home

## 2017-04-21 NOTE — Progress Notes (Signed)
Patient ID: Jonathon Miller, male    DOB: 1925-08-18, 84 y.o.   MRN: [id number]  Chief Complaint  Patient presents with  . Follow-up    Allergies Penicillins  Subjective:   Jonathon Miller is a 84 y.o. male who presents to [facility] today.  HPI Jonathon Miller presents today for follow-up visit.  He is accompanied by his wife and his stepdaughter.  He reports that he has been feeling better.  They report that since discontinuing the beta-blocker that his energy is better.  He is not sleeping as much during the day.  He has been getting out in the yard and doing activities.  They feel like his mood is somewhat improved.  They report that they believe that he had an episode of hypoglycemia after working in the yard.  He did skip a meal and continue to work in the yard and they report that he seemed shaky and was not as responsive.  They gave him some sugar and then he was within normal limits.  He has not had any low blood sugars that they have checked.  They did not check his sugar during this incident.  His daily sugar checks have been ranging from 180-200.  Since discontinuation of the beta-blocker his Pulse is running 60-80.  They report that they did not hear back regarding the ENT referral.  In addition he was set up for physical therapy at the outpatient rehab facility here in town.  They report that they would like it switched to home health since he is unable to drive and his wife is unable to drive.  He does not drive secondary to his dementia.  In addition he does not leave home alone secondary to his dementia.  His stepdaughter reports that he has never wanted any workup or evaluation for his dementia.  She is wondering if he should do that now.  He still reports in the office today that he does not believe he has dementia and he is not interested in any treatment or workup.  His wife and daughter reports that at this time they will continue to agree with his wishes.  They do  question whether he would benefit from any dementia medications.  He has never had an MRI or any imaging of his brain.  He denies any chest pain, shortness of breath, or swelling in his extremities.  His bowel movements are good.  Appetite is good.  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  They did not take the pathology results with them and could not find them.  He is scheduled to follow back up with him with the results.  They do report that he is always itching and scratching his back.  Has been doing this for quite some time even before he moved to [place].  Has been going on for several months.  No lesions or bites.  No history of bedbugs.  Also reports that on his anterior aspect of the right shoulder that he has a rash that had been itching him.  It has scabbed over at this time but they were concerned about it.  He is not work in the yard with his shirt off.    Past Medical History:  Diagnosis Date  . Alzheimer's dementia   . Anemia of chronic disease 03/18/2017  . Cancer (HCC)    Skin  . Cataract    skin  .  CRD (chronic renal disease), stage III ([place]) 03/18/2017  . Diabetes mellitus without complication ([place])   . Hyperlipidemia   . Hypertension   . Myocardial infarction ([place])   . Thyroid disease     Past Surgical History:  Procedure Laterality Date  . APPENDECTOMY    . EYE SURGERY      Family History  Problem Relation Age of Onset  . Diabetes Other      Social History   Socioeconomic History  . Marital status: Married    Spouse name: [name]  . Number of children: 3  . Years of education: 85  . Highest education level: High school graduate  Occupational History  . 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 Occupation: Child psychotherapist  . 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 Financial resource strain: Not hard at all  . Food insecurity:    Worry: Never true    Inability: Never true  .  Transportation needs:    Medical: No    Non-medical: No  Tobacco Use  . Smoking status: Never Smoker  . Smokeless tobacco: Never Used  Substance and Sexual Activity  . Alcohol use: No    Frequency: Never  . Drug use: No  . Sexual activity: Never  Lifestyle  . Physical activity:    Days per week: 0 days    Minutes per session: 0 min  . Stress: To some extent  Relationships  . Social connections:    Talks on phone: Once a week    Gets together: More than three times a week    Attends religious service: Never    Active member of club or organization: No    Attends meetings of clubs or organizations: Never    Relationship status: Married  Other Topics Concern  . Not on file  Social History Narrative   Just moved to [place] with wife [name], wife of 24 years (second)   Lives with Daughter [name] who helps care for them   Likes to Golf-not able anymore   Deer Hunting-not able anymore   Prior occupation was baking/cooking      Current Outpatient Medications on File Prior to Visit  Medication Sig Dispense Refill  . aspirin EC 81 MG tablet Take 81 mg by mouth daily.    [name] glipiZIDE (GLUCOTROL) 5 MG tablet Take by mouth daily before breakfast.    . levothyroxine (SYNTHROID) 50 MCG tablet Take 50 mcg by mouth daily before breakfast.    . linagliptin (TRADJENTA) 5 MG TABS tablet Take 5 mg by mouth daily.    . nitroGLYCERIN (NITROSTAT) 0.4 MG SL tablet Place 0.4 mg under the tongue every 5 (five) minutes as needed for chest pain.    . pioglitazone (ACTOS) 15 MG tablet Take 15 mg by mouth daily.    . vitamin C (ASCORBIC ACID) 500 MG tablet Take 500 mg by mouth daily.     No current facility-administered medications on file prior to visit.     Review of Systems  Constitutional: Negative for appetite change, chills, fever and unexpected weight change.  HENT: Negative for ear discharge, ear pain, sinus pressure, sore throat, trouble swallowing and voice change.   Eyes:  Negative for visual disturbance.  Respiratory: Negative for cough, chest tightness, shortness of breath and wheezing.   Cardiovascular: Negative for chest pain, palpitations and leg swelling.  Gastrointestinal: Negative for abdominal pain, diarrhea, nausea and vomiting.  Genitourinary: Negative for decreased urine volume, dysuria and  frequency.  Musculoskeletal: Positive for gait problem.       Reports that since coming off the beta-blocker he does not feel quite as dizzy.  He does still have some dizziness and instability with his gait per his daughter.  She reports that when he goes up the stairs she is sometimes concerned that he will fall backwards.  They have put up handrails that he is supposed to use when going up the stairs.  He does not use any assistive devices at home.  He has not had a fall since moving to [place].  Skin: Negative for rash.  Neurological: Negative for dizziness, tremors, syncope, facial asymmetry, weakness and headaches.  Hematological: Negative for adenopathy. Does not bruise/bleed easily.  Psychiatric/Behavioral:       He reports that his mood is good.  He denies any suicidal or homicidal ideations.  He is sleeping well.  Family reports no behavioral disturbances.    Current Outpatient Medications on File Prior to Visit  Medication Sig Dispense Refill  . aspirin EC 81 MG tablet Take 81 mg by mouth daily.    [name] glipiZIDE (GLUCOTROL) 5 MG tablet Take by mouth daily before breakfast.    . levothyroxine (SYNTHROID) 50 MCG tablet Take 50 mcg by mouth daily before breakfast.    . linagliptin (TRADJENTA) 5 MG TABS tablet Take 5 mg by mouth daily.    . nitroGLYCERIN (NITROSTAT) 0.4 MG SL tablet Place 0.4 mg under the tongue every 5 (five) minutes as needed for chest pain.    . pioglitazone (ACTOS) 15 MG tablet Take 15 mg by mouth daily.    . vitamin C (ASCORBIC ACID) 500 MG tablet Take 500 mg by mouth daily.     No current facility-administered medications on file  prior to visit.     Objective:   BP 132/64 (BP Location: Left Arm, Patient Position: Sitting, Cuff Size: Normal)   Pulse 93   Temp (!) 97.1 F (36.2 C) (Temporal)   Resp 16   Ht 5\' 3"  (1.6 m)   Wt 146 lb 12 oz (66.6 kg)   SpO2 97%   BMI 26.00 kg/m   Physical Exam  Constitutional: He appears well-developed and well-nourished.  Neck: Normal range of motion. Neck supple.  Cardiovascular: Normal rate, regular rhythm, normal heart sounds and intact distal pulses.  Pulmonary/Chest: Effort normal and breath sounds normal.  Abdominal: Soft. Bowel sounds are normal.  Skin: Skin is warm and dry. Capillary refill takes less than 2 seconds. Rash noted.  Patient does have multiple seborrheic keratosis on the back with evidence of superficial excoriation.  Evidence of linear rash on right shoulder approximate 13 cm in length.  Scabbed area.  No active lesions.  No vesicles.  No associated erythema.  Psychiatric: He has a normal mood and affect. His behavior is normal.  Vitals reviewed.    Assessment and Plan  1. Bradycardia Bradycardia has subsequently resolved after discontinuation of beta-blocker.  Patient has had increased energy level and decreased daytime somnolence.  We will continue to monitor heart rate. 2. Gait instability/weakness Patient with history of gait instability.  Referral for home health for physical therapy placed today.  Patient is unable to leave the home by himself or drive.  Home health for strengthening and fall risk/safety evaluation - Ambulatory referral to [facility]  3. Immunization due  - Pneumococcal conjugate vaccine 13-valent IM-vaccination deferred - Td : Tetanus/diphtheria >7yo Preservative  Free-vaccination given  4. Seborrheic keratoses Discussed with patient  and family that I suspect that the seborrheic keratosis are causing him pruritus.  Would recommend lotion to the back as needed.  Patient has upcoming follow-up with dermatology.  Will get  evaluation at this time  5. Dementia without behavioral disturbance, unspecified dementia type Stepdaughter and wife had questions regarding patient's dementia.  He has been experiencing memory disturbance for many years but has always refused imaging studies and medication and laboratory evaluation.  I did discuss with him that the only other recommended labs other than what we have done would be HIV and syphilis.  He would also be considered to treat him for some mild depression which could have occurred upon moving to this area and leaving friends back in [place] and [place].  Patient is not interested in laboratory testing or imaging studies.  He is not interested in getting lab work today.  I did discuss with family that it would be beneficial to have an MRI prior to starting medications because medications used to treat dementia are specific to certain types of dementia and not necessarily vascular dementia, which she is at risk for.  Family would like to discuss in further detail at next visit and defers labs at this time.  I have placed a lab order and if they change their mind they will let me know.  This discussion was had at the end of the visit they reported that they had changed their mind and would like to proceed with the MRI.  Patient was agreeable with this.  Therefore, we will go ahead and place MRI. - HIV antibody - RPR  6. Rash/skin eruption Area in question is now healed and scabbing over.  This skin eruption is in a specific dermatomal distribution.  We did discuss that this could have been a shingles outbreak or multiple other etiologies.  At this time there is no indication for treatment.  Will call if patient develops any subsequent rashes or problems.  Return in about 1 month (around 05/19/2017) for Follow-up. [name], MD 04/22/2017

## 2017-04-26 ENCOUNTER — Ambulatory Visit (HOSPITAL_COMMUNITY): Payer: Medicare Other

## 2017-05-03 ENCOUNTER — Telehealth: Payer: Self-pay

## 2017-05-03 ENCOUNTER — Telehealth: Payer: Self-pay | Admitting: Family Medicine

## 2017-05-03 NOTE — Telephone Encounter (Signed)
Wants to discuss the rashes on his body and the fact that his glipizide was prescribed for once daily and he used to be on twice daily [phone]

## 2017-05-03 NOTE — Telephone Encounter (Signed)
Patients daughter stopped by the office for mychart questions and to turn in a proxy application per mychart help desk.  She also wants to know if patient needs to take GLIPIZIDE 5mg , 2x daily due to high blood sugar. Cb#: [phone]

## 2017-05-04 NOTE — Telephone Encounter (Signed)
 I believe this is now your patient. Please advise.  Thank you.  S.

## 2017-05-05 MED ORDER — GLIPIZIDE ER 5 MG PO TB24: 5.0000 mg | ORAL_TABLET | Freq: Every day | ORAL | 1 refills | Status: DC

## 2017-05-05 NOTE — Telephone Encounter (Signed)
Please see the other message in the chart. Please advise patient that we did discuss the rash as his last visit and he is going to follow up with his dermatology. You may call daughter-in-law and give her this information. Also, please do confirm that they have a copy of his biopsy results. Gwen Her. Mannie Stabile, MD

## 2017-05-05 NOTE — Telephone Encounter (Signed)
Daughter informed

## 2017-05-05 NOTE — Telephone Encounter (Signed)
I will call in the xl formulation that he can take once a day. Please advise sent to pharmacy.

## 2017-05-08 ENCOUNTER — Emergency Department (HOSPITAL_COMMUNITY): Payer: Medicare Other

## 2017-05-08 ENCOUNTER — Emergency Department (HOSPITAL_COMMUNITY)
Admission: EM | Admit: 2017-05-08 | Discharge: 2017-05-08 | Disposition: A | Discharge status: Home / Self Care | Payer: Medicare Other | Attending: Emergency Medicine | Admitting: Emergency Medicine

## 2017-05-08 ENCOUNTER — Other Ambulatory Visit: Payer: Self-pay

## 2017-05-08 ENCOUNTER — Encounter (HOSPITAL_COMMUNITY): Payer: Self-pay | Admitting: Emergency Medicine

## 2017-05-08 DIAGNOSIS — G309 Alzheimer's disease, unspecified: Secondary | ICD-10-CM | POA: Diagnosis not present

## 2017-05-08 DIAGNOSIS — E039 Hypothyroidism, unspecified: Secondary | ICD-10-CM | POA: Insufficient documentation

## 2017-05-08 DIAGNOSIS — R112 Nausea with vomiting, unspecified: Secondary | ICD-10-CM

## 2017-05-08 DIAGNOSIS — Z7984 Long term (current) use of oral hypoglycemic drugs: Secondary | ICD-10-CM | POA: Insufficient documentation

## 2017-05-08 DIAGNOSIS — R1084 Generalized abdominal pain: Secondary | ICD-10-CM | POA: Diagnosis not present

## 2017-05-08 DIAGNOSIS — I129 Hypertensive chronic kidney disease with stage 1 through stage 4 chronic kidney disease, or unspecified chronic kidney disease: Secondary | ICD-10-CM | POA: Diagnosis not present

## 2017-05-08 DIAGNOSIS — R197 Diarrhea, unspecified: Secondary | ICD-10-CM

## 2017-05-08 DIAGNOSIS — Z85828 Personal history of other malignant neoplasm of skin: Secondary | ICD-10-CM | POA: Insufficient documentation

## 2017-05-08 DIAGNOSIS — Z7982 Long term (current) use of aspirin: Secondary | ICD-10-CM | POA: Insufficient documentation

## 2017-05-08 DIAGNOSIS — F0280 Dementia in other diseases classified elsewhere without behavioral disturbance: Secondary | ICD-10-CM | POA: Insufficient documentation

## 2017-05-08 DIAGNOSIS — E1122 Type 2 diabetes mellitus with diabetic chronic kidney disease: Secondary | ICD-10-CM | POA: Diagnosis not present

## 2017-05-08 DIAGNOSIS — N183 Chronic kidney disease, stage 3 (moderate): Secondary | ICD-10-CM | POA: Insufficient documentation

## 2017-05-08 DIAGNOSIS — R1900 Intra-abdominal and pelvic swelling, mass and lump, unspecified site: Secondary | ICD-10-CM | POA: Diagnosis not present

## 2017-05-08 DIAGNOSIS — R109 Unspecified abdominal pain: Secondary | ICD-10-CM | POA: Diagnosis present

## 2017-05-08 LAB — CBC WITH DIFFERENTIAL/PLATELET
Basophils Absolute: 0 10*3/uL (ref 0.0–0.1)
Basophils Relative: 0 %
EOS ABS: 0 10*3/uL (ref 0.0–0.7)
EOS PCT: 0 %
HCT: 36.8 % — ABNORMAL LOW (ref 39.0–52.0)
Hemoglobin: 12 g/dL — ABNORMAL LOW (ref 13.0–17.0)
LYMPHS ABS: 0.7 10*3/uL (ref 0.7–4.0)
Lymphocytes Relative: 7 %
MCH: 29.3 pg (ref 26.0–34.0)
MCHC: 32.6 g/dL (ref 30.0–36.0)
MCV: 90 fL (ref 78.0–100.0)
MONO ABS: 0.9 10*3/uL (ref 0.1–1.0)
Monocytes Relative: 9 %
Neutro Abs: 8.1 10*3/uL — ABNORMAL HIGH (ref 1.7–7.7)
Neutrophils Relative %: 84 %
PLATELETS: 219 10*3/uL (ref 150–400)
RBC: 4.09 MIL/uL — AB (ref 4.22–5.81)
RDW: 12.7 % (ref 11.5–15.5)
WBC: 9.7 10*3/uL (ref 4.0–10.5)

## 2017-05-08 LAB — URINALYSIS, ROUTINE W REFLEX MICROSCOPIC
BACTERIA UA: NONE SEEN
BILIRUBIN URINE: NEGATIVE
Glucose, UA: NEGATIVE mg/dL
Ketones, ur: 5 mg/dL — AB
LEUKOCYTES UA: NEGATIVE
Nitrite: NEGATIVE
PROTEIN: 30 mg/dL — AB
Specific Gravity, Urine: 1.018 (ref 1.005–1.030)
pH: 5 (ref 5.0–8.0)

## 2017-05-08 LAB — COMPREHENSIVE METABOLIC PANEL
ALT: 17 U/L (ref 17–63)
ANION GAP: 14 (ref 5–15)
AST: 21 U/L (ref 15–41)
Albumin: 3.9 g/dL (ref 3.5–5.0)
Alkaline Phosphatase: 48 U/L (ref 38–126)
BUN: 24 mg/dL — ABNORMAL HIGH (ref 6–20)
CHLORIDE: 96 mmol/L — AB (ref 101–111)
CO2: 23 mmol/L (ref 22–32)
Calcium: 9.7 mg/dL (ref 8.9–10.3)
Creatinine, Ser: 1.33 mg/dL — ABNORMAL HIGH (ref 0.61–1.24)
GFR calc non Af Amer: 45 mL/min — ABNORMAL LOW (ref >60)
GFR, EST AFRICAN AMERICAN: 53 mL/min — AB (ref >60)
Glucose, Bld: 180 mg/dL — ABNORMAL HIGH (ref 65–99)
POTASSIUM: 3.8 mmol/L (ref 3.5–5.1)
Sodium: 133 mmol/L — ABNORMAL LOW (ref 135–145)
Total Bilirubin: 0.8 mg/dL (ref 0.3–1.2)
Total Protein: 7.1 g/dL (ref 6.5–8.1)

## 2017-05-08 LAB — LIPASE, BLOOD: Lipase: 39 U/L (ref 11–51)

## 2017-05-08 LAB — TROPONIN I

## 2017-05-08 MED ORDER — ONDANSETRON HCL 4 MG/2ML IJ SOLN
4.0000 mg | INTRAMUSCULAR | Status: DC | PRN
  Administered 2017-05-08: 4 mg via INTRAVENOUS
  Filled 2017-05-08: qty 2

## 2017-05-08 MED ORDER — SODIUM CHLORIDE 0.9 % IV SOLN
INTRAVENOUS | Status: DC
  Administered 2017-05-08: 100 mL/h via INTRAVENOUS

## 2017-05-08 MED ORDER — FAMOTIDINE 20 MG PO TABS: 20.0000 mg | ORAL_TABLET | Freq: Two times a day (BID) | ORAL | 0 refills | Status: AC

## 2017-05-08 MED ORDER — SODIUM CHLORIDE 0.9 % IV BOLUS
250.0000 mL | Freq: Once | INTRAVENOUS | Status: AC
Start: 2017-05-08 — End: 2017-05-08
  Administered 2017-05-08: 250 mL via INTRAVENOUS

## 2017-05-08 MED ORDER — FAMOTIDINE IN NACL 20-0.9 MG/50ML-% IV SOLN
20.0000 mg | Freq: Once | INTRAVENOUS | Status: AC
  Administered 2017-05-08: 20 mg via INTRAVENOUS
  Filled 2017-05-08: qty 50

## 2017-05-08 MED ORDER — ONDANSETRON 4 MG PO TBDP: 4.0000 mg | ORAL_TABLET | Freq: Three times a day (TID) | ORAL | 0 refills | Status: DC | PRN

## 2017-05-08 NOTE — Discharge Instructions (Signed)
An incidental finding was seen on your CT scan:  "Soft tissue density mass within the lower most portion of the posterior pelvis, posterior to the lower rectum, appearing to be separate from the rectum, measuring 3.7 x 2.2 cm, of uncertain etiology or significance, most likely chronic." Take the prescriptions as directed.  Increase your fluid intake (ie:  Gatoraide) for the next few days.  Eat a bland diet and advance to your regular diet slowly as you can tolerate it.   Avoid full strength juices, as well as milk and milk products until your diarrhea has resolved.   Call your regular medical doctor Monday to schedule a follow up appointment this week to follow up the above finding.  Return to the Emergency Department immediately if not improving (or even worsening) despite taking the medicines as prescribed, any black or bloody stool or vomit, if you develop a fever over "101," or for any other concerns.

## 2017-05-08 NOTE — ED Triage Notes (Signed)
Patient complaining of abdominal pain x 3 days. States vomited x 1, no diarrhea.

## 2017-05-08 NOTE — ED Notes (Signed)
Pt given water about 30 minutes ago. Pt tolerating PO fluids well.

## 2017-05-08 NOTE — ED Provider Notes (Signed)
[facility] EMERGENCY DEPARTMENT Provider Note   CSN: [id number] Arrival date & time: 05/08/17  7824     History   Chief Complaint Chief Complaint  Patient presents with  . Abdominal Pain    HPI Jonathon Miller is a 84 y.o. male.  The history is provided by the patient, the spouse and a relative. The history is limited by the condition of the patient (Hx dementia).  Abdominal Pain    Pt was seen at 0735. Per pt and his family, c/o gradual onset and persistence of multiple intermittent episodes of N/V/D that began 3 days ago.  Describes the stools as "watery." Has been associated with generalized abd "pain." Denies CP/SOB, no back pain, no fevers, no black or blood in stools or emesis.    Past Medical History:  Diagnosis Date  . Alzheimer's dementia   . Anemia of chronic disease 03/18/2017  . Cancer (HCC)    Skin  . Cataract    skin  . CRD (chronic renal disease), stage III ([place]) 03/18/2017  . Diabetes mellitus without complication ([place])   . Hyperlipidemia   . Hypertension   . Myocardial infarction ([place])   . Thyroid disease     Patient Active Problem List   Diagnosis Date Noted  . Nodular basal cell carcinoma 03/30/2017  . Fatigue 03/30/2017  . Bradycardia 03/30/2017  . Proteinuria 03/18/2017  . CRD (chronic renal disease), stage III ([place]) 03/18/2017  . Anemia of chronic disease 03/18/2017  . Uncontrolled type 2 diabetes mellitus with complication ([place]) 23/53/6144  . Alzheimer's dementia without behavioral disturbance 03/17/2017  . Hypothyroidism 03/17/2017  . Hypertension 03/17/2017  . Hyperlipidemia 03/17/2017  . Gait disturbance 03/17/2017  . Falls frequently 03/17/2017    Past Surgical History:  Procedure Laterality Date  . APPENDECTOMY    . EYE SURGERY          Home Medications    Prior to Admission medications   Medication Sig Start Date End Date Taking? Authorizing Provider  aspirin EC 81 MG tablet Take 81 mg by mouth daily.    [provider]  glipiZIDE (GLUCOTROL XL) 5 MG 24 hr tablet Take 1 tablet (5 mg total) by mouth daily with breakfast. 05/05/17   [name], MD  levothyroxine (SYNTHROID) 50 MCG tablet Take 50 mcg by mouth daily before breakfast.    [provider]  linagliptin (TRADJENTA) 5 MG TABS tablet Take 5 mg by mouth daily.    [provider]  nitroGLYCERIN (NITROSTAT) 0.4 MG SL tablet Place 0.4 mg under the tongue every 5 (five) minutes as needed for chest pain.    [provider]  pioglitazone (ACTOS) 15 MG tablet Take 15 mg by mouth daily.    [provider]  vitamin C (ASCORBIC ACID) 500 MG tablet Take 500 mg by mouth daily.    [provider]    Family History Family History  Problem Relation Age of Onset  . Diabetes Other     Social History Social History   Tobacco Use  . Smoking status: Never Smoker  . Smokeless tobacco: Never Used  Substance Use Topics  . Alcohol use: No    Frequency: Never  . Drug use: No     Allergies   Penicillins   Review of Systems Review of Systems  Unable to perform ROS: Dementia  Gastrointestinal: Positive for abdominal pain.     Physical Exam Updated Vital Signs BP (!) 175/88   Pulse 96   Temp (!)  97.5 F (36.4 C) (Oral)   Resp 18   Ht 5\' 3"  (1.6 m)   Wt 66.2 kg (146 lb)   SpO2 96%   BMI 25.86 kg/m    BP 138/74   Pulse 79   Temp (!) 97.5 F (36.4 C) (Oral)   Resp 20   Ht 5\' 3"  (1.6 m)   Wt 66.2 kg (146 lb)   SpO2 96%   BMI 25.86 kg/m    Physical Exam 0740: Physical examination:  Nursing notes reviewed; Vital signs and O2 SAT reviewed;  Constitutional: Well developed, Well nourished, In no acute distress; Head:  Normocephalic, atraumatic; Eyes: EOMI, PERRL, No scleral icterus; ENMT: Mouth and pharynx normal, Mucous membranes dry; Neck: Supple, Full range of motion, No lymphadenopathy; Cardiovascular: Regular rate and rhythm, No gallop; Respiratory: Breath sounds clear & equal  bilaterally, No wheezes.  Speaking full sentences with ease, Normal respiratory effort/excursion; Chest: Nontender, Movement normal; Abdomen: Soft, +diffuse tenderness to palp. No rebound or guarding. Nondistended, Normal bowel sounds; Genitourinary: No CVA tenderness; Extremities: Peripheral pulses normal, No tenderness, No edema, No calf edema or asymmetry.; Neuro: Awake, alert, confused per hx dementia. No facial droop. Speech clear. No gross focal motor or sensory deficits in extremities.; Skin: Color normal, Warm, Dry.   ED Treatments / Results  Labs (all labs ordered are listed, but only abnormal results are displayed)   EKG EKG Interpretation  Date/Time:  Saturday May 08 2017 07:51:06 EDT Ventricular Rate:  94 PR Interval:    QRS Duration: 92 QT Interval:  355 QTC Calculation: 444 R Axis:   52 Text Interpretation:  Sinus rhythm Low voltage, precordial leads No old tracing to compare Confirmed by Francine Graven [phone]) on 05/08/2017 8:01:00 AM   Radiology   Procedures Procedures (including critical care time)  Medications Ordered in ED Medications  ondansetron (ZOFRAN) injection 4 mg (has no administration in time range)  famotidine (PEPCID) IVPB 20 mg premix (has no administration in time range)     Initial Impression / Assessment and Plan / ED Course  I have reviewed the triage vital signs and the nursing notes.  Pertinent labs & imaging results that were available during my care of the patient were reviewed by me and considered in my medical decision making (see chart for details).  MDM Reviewed: previous chart, nursing note and vitals Reviewed previous: labs and ECG Interpretation: labs, ECG, x-ray and CT scan   Results for orders placed or performed during the hospital encounter of 05/08/17  Lipase, blood  Result Value Ref Range   Lipase 39 11 - 51 U/L  Comprehensive metabolic panel  Result Value Ref Range   Sodium 133 (L) 135 - 145 mmol/L    Potassium 3.8 3.5 - 5.1 mmol/L   Chloride 96 (L) 101 - 111 mmol/L   CO2 23 22 - 32 mmol/L   Glucose, Bld 180 (H) 65 - 99 mg/dL   BUN 24 (H) 6 - 20 mg/dL   Creatinine, Ser 1.33 (H) 0.61 - 1.24 mg/dL   Calcium 9.7 8.9 - 10.3 mg/dL   Total Protein 7.1 6.5 - 8.1 g/dL   Albumin 3.9 3.5 - 5.0 g/dL   AST 21 15 - 41 U/L   ALT 17 17 - 63 U/L   Alkaline Phosphatase 48 38 - 126 U/L   Total Bilirubin 0.8 0.3 - 1.2 mg/dL   GFR calc non Af Amer 45 (L) >60 mL/min   GFR calc Af Amer 53 (L) >60 mL/min  Anion gap 14 5 - 15  Urinalysis, Routine w reflex microscopic  Result Value Ref Range   Color, Urine YELLOW YELLOW   APPearance CLEAR CLEAR   Specific Gravity, Urine 1.018 1.005 - 1.030   pH 5.0 5.0 - 8.0   Glucose, UA NEGATIVE NEGATIVE mg/dL   Hgb urine dipstick SMALL (A) NEGATIVE   Bilirubin Urine NEGATIVE NEGATIVE   Ketones, ur 5 (A) NEGATIVE mg/dL   Protein, ur 30 (A) NEGATIVE mg/dL   Nitrite NEGATIVE NEGATIVE   Leukocytes, UA NEGATIVE NEGATIVE   RBC / HPF 0-5 0 - 5 RBC/hpf   WBC, UA 0-5 0 - 5 WBC/hpf   Bacteria, UA NONE SEEN NONE SEEN   Squamous Epithelial / LPF 0-5 (A) NONE SEEN   Mucus PRESENT   CBC with Differential  Result Value Ref Range   WBC 9.7 4.0 - 10.5 K/uL   RBC 4.09 (L) 4.22 - 5.81 MIL/uL   Hemoglobin 12.0 (L) 13.0 - 17.0 g/dL   HCT 36.8 (L) 39.0 - 52.0 %   MCV 90.0 78.0 - 100.0 fL   MCH 29.3 26.0 - 34.0 pg   MCHC 32.6 30.0 - 36.0 g/dL   RDW 12.7 11.5 - 15.5 %   Platelets 219 150 - 400 K/uL   Neutrophils Relative % 84 %   Neutro Abs 8.1 (H) 1.7 - 7.7 K/uL   Lymphocytes Relative 7 %   Lymphs Abs 0.7 0.7 - 4.0 K/uL   Monocytes Relative 9 %   Monocytes Absolute 0.9 0.1 - 1.0 K/uL   Eosinophils Relative 0 %   Eosinophils Absolute 0.0 0.0 - 0.7 K/uL   Basophils Relative 0 %   Basophils Absolute 0.0 0.0 - 0.1 K/uL  Troponin I  Result Value Ref Range   Troponin I <0.03 <0.03 ng/mL   Ct Abdomen Pelvis Wo Contrast Result Date: 05/08/2017 CLINICAL DATA:  Abdominal  pain for 3 days, vomiting for 1 day. History of hypertension, diabetes, chronic kidney disease, dementia, appendectomy. EXAM: CT ABDOMEN AND PELVIS WITHOUT CONTRAST TECHNIQUE: Multidetector CT imaging of the abdomen and pelvis was performed following the standard protocol without IV contrast. COMPARISON:  None. FINDINGS: Lower chest: No acute abnormality. Extensive coronary artery calcifications at the heart base. Hepatobiliary: No focal liver abnormality is seen. Status post cholecystectomy. No biliary dilatation. Pancreas: Unremarkable. No pancreatic ductal dilatation or surrounding inflammatory changes. Spleen: Normal in size without focal abnormality. Adrenals/Urinary Tract: Adrenal glands appear normal. Small RIGHT renal cyst. Kidneys are otherwise unremarkable without suspicious mass, stone or hydronephrosis. No ureteral or bladder calculi identified. Bladder is unremarkable, partially decompressed. Stomach/Bowel: Bowel is normal in caliber. Scattered diverticulosis of the sigmoid and lower descending colon but no focal inflammatory change to suggest acute diverticulitis. Stomach is unremarkable, partially decompressed limiting characterization of its walls. Vascular/Lymphatic: Extensive aortic atherosclerosis. No acute appearing vascular abnormality, characterization limited by the lack of intravascular contrast. No enlarged lymph nodes seen in the abdomen or pelvis. Reproductive: Prostate is unremarkable. Other: Soft tissue density mass within the lower most portion of the posterior pelvis, posterior to the lower rectum, appearing to be separate from the rectum, measuring 3.7 x 2.2 cm, of uncertain etiology or significance, most likely chronic. No free fluid or abscess collection.  No free intraperitoneal air. Musculoskeletal: Degenerative changes throughout the thoracolumbar spine, moderate to severe in degree within the lumbar spine. No acute or suspicious osseous finding. IMPRESSION: 1. Walls of the  stomach are difficult to characterize due to inadequate stomach distension. Stomach wall  thickening/gastritis is difficult to exclude, however, there is no perigastric inflammation or other secondary signs of a gastritis. Overall, no bowel obstruction or convincing evidence of bowel wall inflammation seen. 2. Colonic diverticulosis without evidence of acute diverticulitis. 3. **An incidental finding of potential clinical significance has been found. 3.7 cm mass within the lower pelvis, posterior to the rectum, likely separate from the rectum, no surrounding inflammatory change, of uncertain etiology or significance but most likely chronic and benign. In the absence of lower pelvic pain, would merely recommend follow-up CT pelvis in 2-3 months to ensure stability. ** 4. Aortic atherosclerosis. Electronically Signed   By: Franki Cabot M.D.   On: 05/08/2017 10:05   Dg Chest 2 View Result Date: 05/08/2017 CLINICAL DATA:  Abdominal pain with nausea and vomiting. EXAM: CHEST - 2 VIEW COMPARISON:  None. FINDINGS: The heart size and mediastinal contours are within normal limits. Normal pulmonary vascularity. Atherosclerotic calcification of the aortic arch. Chronic bronchitic changes. No focal consolidation, pleural effusion, or pneumothorax. Minimal bibasilar atelectasis. No acute osseous abnormality. IMPRESSION: No active cardiopulmonary disease. Electronically Signed   By: Titus Dubin M.D.   On: 05/08/2017 09:59    Results for BRALYN, FOLKERT (MRN [id number]) as of 05/08/2017 11:03  Ref. Range 03/17/2017 16:14 05/08/2017 08:03  BUN Latest Ref Range: 6 - 20 mg/dL 29 (H) 24 (H)  Creatinine Latest Ref Range: 0.61 - 1.24 mg/dL 1.59 (H) 1.33 (H)    1110:  H/H and BUN/Cr per baseline. CT as above. Will tx for possible gastritis, f/u PMD regarding incidental finding of pelvic mass. Pt has tol PO well while in the ED without N/V.  No stooling while in the ED.  Abd benign, VSS. Feels better and wants to go home  now. Dx and testing d/w pt and family.  Questions answered.  Verb understanding, agreeable to d/c home with outpt f/u.      Final Clinical Impressions(s) / ED Diagnoses   Final diagnoses:  None    ED Discharge Orders    None       Francine Graven, DO 05/12/17 1738

## 2017-05-19 ENCOUNTER — Ambulatory Visit (INDEPENDENT_AMBULATORY_CARE_PROVIDER_SITE_OTHER): Payer: Medicare Other | Admitting: Family Medicine

## 2017-05-19 ENCOUNTER — Encounter: Payer: Self-pay | Admitting: Family Medicine

## 2017-05-19 ENCOUNTER — Other Ambulatory Visit: Payer: Self-pay

## 2017-05-19 VITALS — BP 138/60 | HR 86 | Temp 98.4°F | Resp 16 | Ht 63.0 in | Wt 139.8 lb

## 2017-05-19 DIAGNOSIS — J209 Acute bronchitis, unspecified: Secondary | ICD-10-CM

## 2017-05-19 DIAGNOSIS — R1900 Intra-abdominal and pelvic swelling, mass and lump, unspecified site: Secondary | ICD-10-CM | POA: Diagnosis not present

## 2017-05-19 DIAGNOSIS — F321 Major depressive disorder, single episode, moderate: Secondary | ICD-10-CM

## 2017-05-19 MED ORDER — TRIAMCINOLONE ACETONIDE 40 MG/ML IJ SUSP
40.0000 mg | Freq: Once | INTRAMUSCULAR | Status: AC
  Administered 2017-05-19: 40 mg via INTRAMUSCULAR

## 2017-05-19 MED ORDER — CITALOPRAM HYDROBROMIDE 20 MG PO TABS: 10.0000 mg | ORAL_TABLET | Freq: Every day | ORAL | 0 refills | Status: DC

## 2017-05-19 NOTE — Progress Notes (Signed)
Patient ID: Jonathon Miller, male    DOB: [birth year], 84 y.o.   MRN: [id number]  Chief Complaint  Patient presents with  . Follow-up    Allergies Penicillins  Subjective:   Jonathon Miller is a 84 y.o. male who presents to [facility] today.  HPI Patient presents today for a visit.  He is accompanied by his wife and his daughter.  They report that he went to the ED on 05/08/2017 and treated for nausea/vomiting/diarrhea. Had a CT scan done and chest xray and labs. Was given IVF. The diarrhea and vomiting went away and patient was back to his usual state of health. Then on 05/15/17 went to [facility] to be seen b/c of congestion, coughing, and URI symptoms. Was seen at the [facility] and was treated with antibiotics and albuterol inhaler.  Daughter reports that he was given a diagnosis of pneumonia.  They are not sure which antibiotic he was on.  Was also given some Tessalon Perles and and inhaler.  He has not been using the inhaler but occasionally.  Uses the Tessalon Perles approximately 3 times a day.  Patient reports that he is still coughing throughout the day.  His wife reports that he has been coughing throughout the night and it makes her difficult to sleep.  He does report that he feels better. Reports that does not feel SOB with walking. Has not had a sore throat. Voice is the same. Does not hurt to swallow. Cough is keeping him up at night. Coughing up sputum which is clear at this time. Has been wheezing some and daughter gave him some albuterol.  Patient and his family report that his mood is not good. He is not happy per his report b/c he is tired of being sick.  He reports that whole body feels achey and he is tired of coughing.  Is not happy about the fact that he has moved to this area.  Reports that since he moved here has felt down and depressed. Has had to leave all his friends in [place]. Has met a friend that comes to the house and takes him places and takes him to church.   His daughter knows this person and it is someone that she works with.  He reports that he is sleeping well.  Daughter reports that he is actually been sleeping more than usual and tends to napping all through the day. Not getting out in the yard and doing the things that he can for fun.  Blood pressures been running well.  Checking sugars at home.  No side effects with his medication.  Was feeling better after his blood pressure medication was discontinued and was not having problems.  Now reports that is just irritated with being sick all the time.  Daughter reports that they were told at the emergency department that he has a questionable mass in his pelvic region.  She reports that they are not interested in doing any evaluation at this time.  Patient reports she is not in any pain.  Reports his bowel movements are normal.  Urinating normal.  No skin problems at this time other than the basal cell carcinoma fixed on his lip.  Has an upcoming appointment with dermatology.   Past Medical History:  Diagnosis Date  . Alzheimer's dementia   . Anemia of chronic disease 03/18/2017  . Cancer (HCC)    Skin  . Cataract    skin  . CRD (chronic renal disease), stage  III ([place]) 03/18/2017  . Diabetes mellitus without complication ([place])   . Hyperlipidemia   . Hypertension   . Myocardial infarction ([place])   . Thyroid disease     Past Surgical History:  Procedure Laterality Date  . APPENDECTOMY    . EYE SURGERY      Family History  Problem Relation Age of Onset  . Diabetes Other      Social History   Socioeconomic History  . Marital status: Married    Spouse name: [name]  . Number of children: 3  . Years of education: 39  . Highest education level: High school graduate  Occupational History  . 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 Occupation: Child psychotherapist  . 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 Financial resource strain: Not hard at all  . Food insecurity:    Worry: Never  true    Inability: Never true  . Transportation needs:    Medical: No    Non-medical: No  Tobacco Use  . Smoking status: Never Smoker  . Smokeless tobacco: Never Used  Substance and Sexual Activity  . Alcohol use: No    Frequency: Never  . Drug use: No  . Sexual activity: Never  Lifestyle  . Physical activity:    Days per week: 0 days    Minutes per session: 0 min  . Stress: To some extent  Relationships  . Social connections:    Talks on phone: Once a week    Gets together: More than three times a week    Attends religious service: Never    Active member of club or organization: No    Attends meetings of clubs or organizations: Never    Relationship status: Married  Other Topics Concern  . Not on file  Social History Narrative   Just moved to [place] with wife [name], wife of 24 years (second)   Lives with Daughter [name] who helps care for them   Likes to Golf-not able anymore   Deer Hunting-not able anymore   Prior occupation was baking/cooking      Current Outpatient Medications on File Prior to Visit  Medication Sig Dispense Refill  . albuterol (PROVENTIL HFA;VENTOLIN HFA) 108 (90 Base) MCG/ACT inhaler Inhale into the lungs every 6 (six) hours as needed for wheezing or shortness of breath.    [name] aspirin EC 81 MG tablet Take 81 mg by mouth daily.    . famotidine (PEPCID) 20 MG tablet Take 1 tablet (20 mg total) by mouth 2 (two) times daily. 30 tablet 0  . glipiZIDE (GLUCOTROL XL) 5 MG 24 hr tablet Take 1 tablet (5 mg total) by mouth daily with breakfast. 30 tablet 1  . levothyroxine (SYNTHROID) 50 MCG tablet Take 50 mcg by mouth daily before breakfast.    . linagliptin (TRADJENTA) 5 MG TABS tablet Take 5 mg by mouth daily.    . nitroGLYCERIN (NITROSTAT) 0.4 MG SL tablet Place 0.4 mg under the tongue every 5 (five) minutes as needed for chest pain.    [name] ondansetron (ZOFRAN ODT) 4 MG disintegrating tablet Take 1 tablet (4 mg total) by mouth every 8 (eight) hours  as needed for nausea or vomiting. 6 tablet 0  . pioglitazone (ACTOS) 15 MG tablet Take 15 mg by mouth daily.    . vitamin C (ASCORBIC ACID) 500 MG tablet Take 500 mg by mouth daily.     No current facility-administered medications on file prior to visit.  Review of Systems  Constitutional: Negative for appetite change, chills, fever and unexpected weight change.  HENT: Negative for congestion, dental problem, ear discharge, nosebleeds, postnasal drip, sinus pressure, sneezing, sore throat, trouble swallowing and voice change.   Eyes: Negative for visual disturbance.  Respiratory: Positive for cough. Negative for chest tightness, shortness of breath and wheezing.   Cardiovascular: Negative for chest pain, palpitations and leg swelling.  Gastrointestinal: Negative for abdominal pain, diarrhea, nausea and vomiting.  Genitourinary: Negative for decreased urine volume, dysuria, frequency, hematuria and urgency.  Skin: Negative for rash.  Neurological: Negative for dizziness, tremors, syncope, facial asymmetry, weakness and headaches.  Hematological: Negative for adenopathy. Does not bruise/bleed easily.     Objective:   BP 138/60 (BP Location: Left Arm, Patient Position: Sitting, Cuff Size: Normal)   Pulse 86   Temp 98.4 F (36.9 C) (Temporal)   Resp 16   Ht '5\' 3"'  (1.6 m)   Wt 139 lb 12 oz (63.4 kg)   SpO2 95%   BMI 24.76 kg/m   Physical Exam  Constitutional: He is oriented to person, place, and time. He appears well-developed and well-nourished.  HENT:  Head: Normocephalic and atraumatic.  Mouth/Throat: Uvula is midline, oropharynx is clear and moist and mucous membranes are normal. He has dentures. No posterior oropharyngeal edema or posterior oropharyngeal erythema. No tonsillar exudate.  Eyes: Pupils are equal, round, and reactive to light. EOM are normal. No scleral icterus.  Neck: Normal range of motion. Neck supple. No JVD present. No thyromegaly present.    Cardiovascular: Normal rate, regular rhythm and normal heart sounds.  Pulmonary/Chest: Effort normal and breath sounds normal. He has no wheezes. He has no rales.  Abdominal: Soft. Bowel sounds are normal. There is no tenderness.  Musculoskeletal: He exhibits no edema.  Lymphadenopathy:    He has no cervical adenopathy.  Neurological: He is alert and oriented to person, place, and time. No cranial nerve deficit.  Skin: Skin is warm, dry and intact. Capillary refill takes less than 2 seconds.  Psychiatric: Thought content normal. His affect is blunt. His speech is delayed. He is slowed. He is not aggressive and not combative. Cognition and memory are impaired. He exhibits a depressed mood. He expresses no homicidal and no suicidal ideation. He expresses no suicidal plans and no homicidal plans. He exhibits abnormal recent memory and abnormal remote memory.  Vitals reviewed.  Depression screen PHQ 2/9 03/17/2017  Decreased Interest 1  Down, Depressed, Hopeless 3  PHQ - 2 Score 4  Altered sleeping 2  Change in appetite 3  Feeling bad or failure about yourself  0  Trouble concentrating 3  Moving slowly or fidgety/restless 0  Suicidal thoughts 0  PHQ-9 Score 12   Patient defers PHQ today, he does not want to answer all the quesitons.  Assessment and Plan   1. Depression, major, single episode, moderate ([place]) Discussed with patient that I do believe that he has a diagnosis of depression.  This is been persistent over the past several months since moving here.  In addition we discussed that this can adversely affect his health and because of worsening of his dementia.  He is agreeable to try medicine at this time.  We discussed options with him and his wife and daughter today.  We will try Celexa.  Risks versus benefits discussed. Start Celexa 10 mg p.o. daily.  Follow-up in 2 weeks for evaluation.  Counseled concerning worrisome signs and symptoms.  If develop call or return to  clinic. Suicide risks evaluated and documented in note if present or in the area below.  Patient has protective factors of family and community support.  Patient reports that family believes is behaving rationally. Patient displays problem solving skills.   Patient specifically denies suicide ideation. Patient has access/information to healthcare contacts if situation or mood changes where patient is a risk to self or others or mood becomes unstable.   During the encounter, the patient had good eye contact and firm handshake regarding safety contract and agreement to seek help if mood worsens and not to harm self.   Patient understands the treatment plan and is in agreement. Agrees to keep follow up and call prior or return to clinic if needed.   - citalopram (CELEXA) 20 MG tablet; Take 0.5 tablets (10 mg total) by mouth daily.  Dispense: 30 tablet; Refill: 0  2. Bronchospasm with bronchitis, acute Patient with persistent cough and recent diagnosis of pneumonia/bronchitis.  His lungs are clear today with no obvious wheezing.  He is requesting a steroid injection and reports he is gotten his yearly for his breathing for many years.  He has been taking the [employer] that were prescribed at the urgent care clinic.   They are also counseled regarding cough after diagnosis of bronchitis can be within normal limits.  They were counseled regarding worrisome signs and symptoms and if those develop to please call or return to clinic.  He can continue to use the albuterol inhaler as needed.  Pharmacy was called today to confirm that patient was given Zithromax Z-PAK.  He has completed this medication.  Chest x-ray was reviewed from emergency department visit at [facility] hospital. - triamcinolone acetonide (KENALOG-40) injection 40 mg  3. Pelvic mass CT scan of the abdomen and pelvis was reviewed and discussed with patient and his wife, and daughter-in-law.  At this time they do not wish to pursue any  evaluation of this lesion.  Patient is not symptomatic.  They wish to follow-up and discuss at the next office visit. Office visit was 48 minutes.  Greater than 50% of office visit spent counseling, coordinating care, reviewing and discussing results from emergency department. No follow-ups on file. [name], MD 05/19/2017

## 2017-05-26 ENCOUNTER — Ambulatory Visit (HOSPITAL_COMMUNITY): Payer: Medicare Other

## 2017-05-27 ENCOUNTER — Ambulatory Visit (INDEPENDENT_AMBULATORY_CARE_PROVIDER_SITE_OTHER): Payer: Medicare Other | Admitting: Otolaryngology

## 2017-05-27 DIAGNOSIS — H6123 Impacted cerumen, bilateral: Secondary | ICD-10-CM

## 2017-05-27 DIAGNOSIS — H903 Sensorineural hearing loss, bilateral: Secondary | ICD-10-CM

## 2017-05-28 ENCOUNTER — Emergency Department (HOSPITAL_COMMUNITY): Payer: Medicare Other

## 2017-05-28 ENCOUNTER — Emergency Department (HOSPITAL_COMMUNITY)
Admission: EM | Admit: 2017-05-28 | Discharge: 2017-05-28 | Disposition: A | Discharge status: Home / Self Care | Payer: Medicare Other | Attending: Emergency Medicine | Admitting: Emergency Medicine

## 2017-05-28 ENCOUNTER — Other Ambulatory Visit: Payer: Self-pay | Admitting: Family Medicine

## 2017-05-28 ENCOUNTER — Encounter (HOSPITAL_COMMUNITY): Payer: Self-pay | Admitting: Emergency Medicine

## 2017-05-28 ENCOUNTER — Telehealth: Payer: Self-pay | Admitting: Family Medicine

## 2017-05-28 ENCOUNTER — Other Ambulatory Visit: Payer: Self-pay

## 2017-05-28 DIAGNOSIS — R0989 Other specified symptoms and signs involving the circulatory and respiratory systems: Secondary | ICD-10-CM | POA: Insufficient documentation

## 2017-05-28 DIAGNOSIS — J40 Bronchitis, not specified as acute or chronic: Secondary | ICD-10-CM | POA: Diagnosis not present

## 2017-05-28 DIAGNOSIS — I129 Hypertensive chronic kidney disease with stage 1 through stage 4 chronic kidney disease, or unspecified chronic kidney disease: Secondary | ICD-10-CM | POA: Diagnosis not present

## 2017-05-28 DIAGNOSIS — R600 Localized edema: Secondary | ICD-10-CM | POA: Diagnosis not present

## 2017-05-28 DIAGNOSIS — Z79899 Other long term (current) drug therapy: Secondary | ICD-10-CM | POA: Diagnosis not present

## 2017-05-28 DIAGNOSIS — M542 Cervicalgia: Secondary | ICD-10-CM

## 2017-05-28 DIAGNOSIS — I252 Old myocardial infarction: Secondary | ICD-10-CM | POA: Insufficient documentation

## 2017-05-28 DIAGNOSIS — E119 Type 2 diabetes mellitus without complications: Secondary | ICD-10-CM | POA: Diagnosis not present

## 2017-05-28 DIAGNOSIS — E785 Hyperlipidemia, unspecified: Secondary | ICD-10-CM | POA: Diagnosis not present

## 2017-05-28 DIAGNOSIS — N183 Chronic kidney disease, stage 3 (moderate): Secondary | ICD-10-CM | POA: Insufficient documentation

## 2017-05-28 DIAGNOSIS — F0390 Unspecified dementia without behavioral disturbance: Secondary | ICD-10-CM | POA: Diagnosis not present

## 2017-05-28 HISTORY — DX: Gastritis, unspecified, without bleeding: K29.70

## 2017-05-28 LAB — URINALYSIS, ROUTINE W REFLEX MICROSCOPIC
Bacteria, UA: NONE SEEN
Bilirubin Urine: NEGATIVE
HGB URINE DIPSTICK: NEGATIVE
Ketones, ur: NEGATIVE mg/dL
Leukocytes, UA: NEGATIVE
NITRITE: NEGATIVE
PH: 5 (ref 5.0–8.0)
Protein, ur: 30 mg/dL — AB
Specific Gravity, Urine: 1.024 (ref 1.005–1.030)

## 2017-05-28 LAB — CBC WITH DIFFERENTIAL/PLATELET
BASOS ABS: 0 10*3/uL (ref 0.0–0.1)
Basophils Relative: 0 %
EOS PCT: 0 %
Eosinophils Absolute: 0 10*3/uL (ref 0.0–0.7)
HEMATOCRIT: 29.9 % — AB (ref 39.0–52.0)
Hemoglobin: 9.8 g/dL — ABNORMAL LOW (ref 13.0–17.0)
LYMPHS ABS: 0.6 10*3/uL — AB (ref 0.7–4.0)
LYMPHS PCT: 3 %
MCH: 29.7 pg (ref 26.0–34.0)
MCHC: 32.8 g/dL (ref 30.0–36.0)
MCV: 90.6 fL (ref 78.0–100.0)
MONOS PCT: 7 %
Monocytes Absolute: 1.4 10*3/uL — ABNORMAL HIGH (ref 0.1–1.0)
NEUTROS ABS: 16.3 10*3/uL — AB (ref 1.7–7.7)
Neutrophils Relative %: 90 %
PLATELETS: 479 10*3/uL — AB (ref 150–400)
RBC: 3.3 MIL/uL — ABNORMAL LOW (ref 4.22–5.81)
RDW: 13 % (ref 11.5–15.5)
WBC: 18.3 10*3/uL — ABNORMAL HIGH (ref 4.0–10.5)

## 2017-05-28 LAB — COMPREHENSIVE METABOLIC PANEL
ALT: 13 U/L — ABNORMAL LOW (ref 17–63)
AST: 14 U/L — ABNORMAL LOW (ref 15–41)
Albumin: 3 g/dL — ABNORMAL LOW (ref 3.5–5.0)
Alkaline Phosphatase: 67 U/L (ref 38–126)
Anion gap: 14 (ref 5–15)
BILIRUBIN TOTAL: 0.7 mg/dL (ref 0.3–1.2)
BUN: 24 mg/dL — ABNORMAL HIGH (ref 6–20)
CO2: 28 mmol/L (ref 22–32)
CREATININE: 1.34 mg/dL — AB (ref 0.61–1.24)
Calcium: 9.5 mg/dL (ref 8.9–10.3)
Chloride: 91 mmol/L — ABNORMAL LOW (ref 101–111)
GFR, EST AFRICAN AMERICAN: 52 mL/min — AB (ref >60)
GFR, EST NON AFRICAN AMERICAN: 45 mL/min — AB (ref >60)
Glucose, Bld: 379 mg/dL — ABNORMAL HIGH (ref 65–99)
POTASSIUM: 4.4 mmol/L (ref 3.5–5.1)
Sodium: 133 mmol/L — ABNORMAL LOW (ref 135–145)
TOTAL PROTEIN: 7.3 g/dL (ref 6.5–8.1)

## 2017-05-28 LAB — BRAIN NATRIURETIC PEPTIDE: B Natriuretic Peptide: 67 pg/mL (ref 0.0–100.0)

## 2017-05-28 LAB — TROPONIN I: Troponin I: <0.03 ng/mL (ref <0.03)

## 2017-05-28 MED ORDER — ACETAMINOPHEN 325 MG PO TABS
650.0000 mg | ORAL_TABLET | Freq: Once | ORAL | Status: AC
  Administered 2017-05-28: 650 mg via ORAL
  Filled 2017-05-28: qty 2

## 2017-05-28 MED ORDER — METHOCARBAMOL 500 MG PO TABS: 500.0000 mg | ORAL_TABLET | Freq: Three times a day (TID) | ORAL | 0 refills | Status: DC | PRN

## 2017-05-28 MED ORDER — METHOCARBAMOL 500 MG PO TABS
500.0000 mg | ORAL_TABLET | Freq: Once | ORAL | Status: AC
  Administered 2017-05-28: 500 mg via ORAL
  Filled 2017-05-28: qty 1

## 2017-05-28 MED ORDER — AZITHROMYCIN 250 MG PO TABS: 250.0000 mg | ORAL_TABLET | Freq: Every day | ORAL | 0 refills | Status: DC

## 2017-05-28 NOTE — ED Notes (Signed)
Pt ambulated 1 staff assist in the hall, steady gait, O2 did not fall below 95%.

## 2017-05-28 NOTE — Telephone Encounter (Signed)
Spoke to [place], patient'w wife. She states that Jonathon Miller is getting weaker and weaker by the day.  She states it started all of a sudden. She states they thought it was an issue for the chiropractor but that's not helping. The chiropractor told them his muscles were just tight from all the coughing of bronchitis. Numbness and weakness is bilaterally arms and legs. His legs and feet are swelling. Jonathon Miller is not wanting to eat because he does not want to have a bowel movement. [name] states that he has no slurred speech, no facial drooping, no difficulty when he does eat. He is drinking. He was unable to sign paperwork at ENT the other day. She is very concerned. She has not given him his morning medications yet because she is unsure whether she should. I advised to hold them until I spoke to Dr. Mannie Stabile. Please advise on what I should ask/inform.

## 2017-05-28 NOTE — ED Triage Notes (Signed)
Patient c/o neck and back pain with numbness in hands and feet. Per wife pain and numbness getting gradually worse. Wife states seen chiropractor 2 day ago because of back pain from coughing, related to bronchitis.

## 2017-05-28 NOTE — Telephone Encounter (Signed)
Jonathon Miller advised.

## 2017-05-28 NOTE — Telephone Encounter (Signed)
Please advise for them to go to the ED for evaluation. If she is unable to drive, they should call EMS. He is going to need labs and evaluation.

## 2017-05-28 NOTE — ED Provider Notes (Signed)
[facility] EMERGENCY DEPARTMENT Provider Note   CSN: [id number] Arrival date & time: 05/28/17  1204     History   Chief Complaint Chief Complaint  Patient presents with  . Neck Pain    HPI Jonathon Miller is a 84 y.o. male.  HPI Patient is a poor historian due to dementia.  Level 5 caveat.  Presents with wife and family friend.  Per wife patient has been less active and complaining of pain to his neck and back.  He said numbness to his left arm and left leg.  Also has persistent nonproductive cough.   Past Medical History:  Diagnosis Date  . Alzheimer's dementia   . Anemia of chronic disease 03/18/2017  . Cancer (HCC)    Skin  . Cataract    skin  . CRD (chronic renal disease), stage III ([place]) 03/18/2017  . Diabetes mellitus without complication ([place])   . Gastritis   . Hyperlipidemia   . Hypertension   . Myocardial infarction ([place])   . Thyroid disease     Patient Active Problem List   Diagnosis Date Noted  . Nodular basal cell carcinoma 03/30/2017  . Fatigue 03/30/2017  . Bradycardia 03/30/2017  . Proteinuria 03/18/2017  . CRD (chronic renal disease), stage III ([place]) 03/18/2017  . Anemia of chronic disease 03/18/2017  . Uncontrolled type 2 diabetes mellitus with complication ([place]) 82/99/3716  . Alzheimer's dementia without behavioral disturbance 03/17/2017  . Hypothyroidism 03/17/2017  . Hypertension 03/17/2017  . Hyperlipidemia 03/17/2017  . Gait disturbance 03/17/2017  . Falls frequently 03/17/2017    Past Surgical History:  Procedure Laterality Date  . APPENDECTOMY    . EYE SURGERY          Home Medications    Prior to Admission medications   Medication Sig Start Date End Date Taking? Authorizing Provider  albuterol (PROVENTIL HFA;VENTOLIN HFA) 108 (90 Base) MCG/ACT inhaler Inhale into the lungs every 6 (six) hours as needed for wheezing or shortness of breath.    [provider]  aspirin EC 81 MG tablet Take 81 mg by mouth daily.     [provider]  azithromycin (ZITHROMAX) 250 MG tablet Take 1 tablet (250 mg total) by mouth daily. Take first 2 tablets together, then 1 every day until finished. 05/28/17   Julianne Rice, MD  citalopram (CELEXA) 20 MG tablet Take 0.5 tablets (10 mg total) by mouth daily. 05/19/17   [name], MD  famotidine (PEPCID) 20 MG tablet Take 1 tablet (20 mg total) by mouth 2 (two) times daily. 05/08/17   Francine Graven, DO  glipiZIDE (GLUCOTROL XL) 5 MG 24 hr tablet TAKE 1 TABLET BY MOUTH EVERY DAY WITH BREAKFAST 05/28/17   [name], MD  levothyroxine (SYNTHROID) 50 MCG tablet Take 50 mcg by mouth daily before breakfast.    [provider]  linagliptin (TRADJENTA) 5 MG TABS tablet Take 5 mg by mouth daily.    [provider]  methocarbamol (ROBAXIN) 500 MG tablet Take 1 tablet (500 mg total) by mouth every 8 (eight) hours as needed for muscle spasms. 05/28/17   Julianne Rice, MD  nitroGLYCERIN (NITROSTAT) 0.4 MG SL tablet Place 0.4 mg under the tongue every 5 (five) minutes as needed for chest pain.    [provider]  ondansetron (ZOFRAN ODT) 4 MG disintegrating tablet Take 1 tablet (4 mg total) by mouth every 8 (eight) hours as needed for nausea or vomiting. 05/08/17   Francine Graven, DO  pioglitazone (ACTOS) 15 MG  tablet Take 15 mg by mouth daily.    [provider]  vitamin C (ASCORBIC ACID) 500 MG tablet Take 500 mg by mouth daily.    [provider]    Family History Family History  Problem Relation Age of Onset  . Diabetes Other     Social History Social History   Tobacco Use  . Smoking status: Never Smoker  . Smokeless tobacco: Never Used  Substance Use Topics  . Alcohol use: No    Frequency: Never  . Drug use: No     Allergies   Penicillins   Review of Systems Review of Systems  Unable to perform ROS: Dementia     Physical Exam Updated Vital Signs BP 116/64   Pulse 78   Temp 98.2 F (36.8 C)    Resp (!) 21   Ht 5\' 3"  (1.6 m)   Wt 63 kg (139 lb)   SpO2 95%   BMI 24.62 kg/m   Physical Exam  Constitutional: He appears well-developed and well-nourished. No distress.  HENT:  Head: Normocephalic and atraumatic.  Mouth/Throat: Oropharynx is clear and moist. No oropharyngeal exudate.  Eyes: Pupils are equal, round, and reactive to light. EOM are normal.  Neck: Normal range of motion. Neck supple.  Diffuse posterior midline cervical tenderness to palpation.  No obvious step-offs or deformities.  No meningismus  Cardiovascular: Normal rate and regular rhythm. Exam reveals no gallop and no friction rub.  No murmur heard. Pulmonary/Chest: Effort normal. No stridor. No respiratory distress. He has no wheezes. He has rales.  Few scattered rhonchi  Abdominal: Soft. Bowel sounds are normal. There is no tenderness. There is no rebound and no guarding.  Musculoskeletal: Normal range of motion. He exhibits edema. He exhibits no tenderness.  2+ bilateral lower extremity pitting edema.  Distal pulses intact.  No midline thoracic lumbar tenderness.  No CVA tenderness.  Neurological: He is alert.  Follows simple commands.  Hard of hearing.  4/5 left upper extremity motor.  5/5 right upper extremity, bilateral lower extremity motor.  Cranial nerves II through XII grossly intact.  Sensation to light touch grossly intact.  Skin: Skin is warm and dry. Capillary refill takes less than 2 seconds. No rash noted. He is not diaphoretic. No erythema.  Psychiatric: He has a normal mood and affect. His behavior is normal.  Nursing note and vitals reviewed.    ED Treatments / Results  Labs (all labs ordered are listed, but only abnormal results are displayed) Labs Reviewed  CBC WITH DIFFERENTIAL/PLATELET - Abnormal; Notable for the following components:      Result Value   WBC 18.3 (*)    RBC 3.30 (*)    Hemoglobin 9.8 (*)    HCT 29.9 (*)    Platelets 479 (*)    Neutro Abs 16.3 (*)    Lymphs Abs 0.6  (*)    Monocytes Absolute 1.4 (*)    All other components within normal limits  COMPREHENSIVE METABOLIC PANEL - Abnormal; Notable for the following components:   Sodium 133 (*)    Chloride 91 (*)    Glucose, Bld 379 (*)    BUN 24 (*)    Creatinine, Ser 1.34 (*)    Albumin 3.0 (*)    AST 14 (*)    ALT 13 (*)    GFR calc non Af Amer 45 (*)    GFR calc Af Amer 52 (*)    All other components within normal limits  URINALYSIS, ROUTINE  W REFLEX MICROSCOPIC - Abnormal; Notable for the following components:   Glucose, UA >=500 (*)    Protein, ur 30 (*)    All other components within normal limits  TROPONIN I  BRAIN NATRIURETIC PEPTIDE    EKG EKG Interpretation  Date/Time:  Friday May 28 2017 13:05:08 EDT Ventricular Rate:  82 PR Interval:    QRS Duration: 96 QT Interval:  376 QTC Calculation: 440 R Axis:   51 Text Interpretation:  Sinus rhythm Atrial premature complex Borderline low voltage, extremity leads Consider anterior infarct When compared to prior, no signifiant changes seen.  No STEMI Confirmed by Antony Blackbird [phone]) on 05/29/2017 8:02:33 PM   Radiology Dg Chest 2 View  Result Date: 05/28/2017 CLINICAL DATA:  Cough. EXAM: CHEST - 2 VIEW COMPARISON:  05/08/2017. FINDINGS: Normal sized heart. Tortuous and calcified thoracic aorta. Clear lungs. Mild peribronchial thickening. Diffuse osteopenia and minimal right shoulder degenerative changes. Thoracic spine degenerative changes. Cholecystectomy clips. IMPRESSION: Mild bronchitic changes. Electronically Signed   By: Claudie Revering M.D.   On: 05/28/2017 13:44   Ct Head Wo Contrast  Result Date: 05/28/2017 CLINICAL DATA:  Neck and back pain, numbness in hands and feet EXAM: CT HEAD WITHOUT CONTRAST CT CERVICAL SPINE WITHOUT CONTRAST TECHNIQUE: Multidetector CT imaging of the head and cervical spine was performed following the standard protocol without intravenous contrast. Multiplanar CT image reconstructions of the cervical spine  were also generated. COMPARISON:  None. FINDINGS: CT HEAD FINDINGS Brain: No evidence of acute infarction, hemorrhage, extra-axial collection or mass lesion/mass effect. Mild cortical and central atrophy. Secondary ventricular prominence. Vascular: Intracranial atherosclerosis. Skull: Normal. Negative for fracture or focal lesion. Sinuses/Orbits: The visualized paranasal sinuses are essentially clear. The mastoid air cells are unopacified. Other: None. CT CERVICAL SPINE FINDINGS Alignment: Normal cervical lordosis. Skull base and vertebrae: No acute fracture. No primary bone lesion or focal pathologic process. Soft tissues and spinal canal: No prevertebral fluid or swelling. No visible canal hematoma. Disc levels:  Moderate degenerative changes at C4-5 and C5-6. Mild neural foraminal narrowing on the right at C3-4 and on the left at C4-5. Spinal canal is patent. Upper chest: Visualized lung apices are clear. Other: Visualized thyroid is unremarkable. IMPRESSION: No evidence of acute intracranial abnormality. Mild cortical atrophy. Mild to moderate degenerative changes of the mid cervical spine, as above. Electronically Signed   By: Julian Hy M.D.   On: 05/28/2017 13:41   Ct Cervical Spine Wo Contrast  Result Date: 05/28/2017 CLINICAL DATA:  Neck and back pain, numbness in hands and feet EXAM: CT HEAD WITHOUT CONTRAST CT CERVICAL SPINE WITHOUT CONTRAST TECHNIQUE: Multidetector CT imaging of the head and cervical spine was performed following the standard protocol without intravenous contrast. Multiplanar CT image reconstructions of the cervical spine were also generated. COMPARISON:  None. FINDINGS: CT HEAD FINDINGS Brain: No evidence of acute infarction, hemorrhage, extra-axial collection or mass lesion/mass effect. Mild cortical and central atrophy. Secondary ventricular prominence. Vascular: Intracranial atherosclerosis. Skull: Normal. Negative for fracture or focal lesion. Sinuses/Orbits: The  visualized paranasal sinuses are essentially clear. The mastoid air cells are unopacified. Other: None. CT CERVICAL SPINE FINDINGS Alignment: Normal cervical lordosis. Skull base and vertebrae: No acute fracture. No primary bone lesion or focal pathologic process. Soft tissues and spinal canal: No prevertebral fluid or swelling. No visible canal hematoma. Disc levels:  Moderate degenerative changes at C4-5 and C5-6. Mild neural foraminal narrowing on the right at C3-4 and on the left at C4-5. Spinal canal is patent.  Upper chest: Visualized lung apices are clear. Other: Visualized thyroid is unremarkable. IMPRESSION: No evidence of acute intracranial abnormality. Mild cortical atrophy. Mild to moderate degenerative changes of the mid cervical spine, as above. Electronically Signed   By: Julian Hy M.D.   On: 05/28/2017 13:41    Procedures Procedures (including critical care time)  Medications Ordered in ED Medications  acetaminophen (TYLENOL) tablet 650 mg (650 mg Oral Given 05/28/17 1635)  methocarbamol (ROBAXIN) tablet 500 mg (500 mg Oral Given 05/28/17 1635)     Initial Impression / Assessment and Plan / ED Course  I have reviewed the triage vital signs and the nursing notes.  Pertinent labs & imaging results that were available during my care of the patient were reviewed by me and considered in my medical decision making (see chart for details).     Symptoms likely related to radiculopathy.  States symptoms have improved after medication.  Patient does have bronchitic changes on x-ray.  Elevation of white blood cell count.  Will treat with antibiotic.  Patient is ambulating without difficulty.  Appears to be at his baseline.  Will discharge home with family with return precautions.  Final Clinical Impressions(s) / ED Diagnoses   Final diagnoses:  Bronchitis  Neck pain    ED Discharge Orders        Ordered    methocarbamol (ROBAXIN) 500 MG tablet  Every 8 hours PRN     05/28/17  1720    azithromycin (ZITHROMAX) 250 MG tablet  Daily     05/28/17 [place]     05/28/17 1720    Face-to-face encounter (required for Medicare/Medicaid patients)    Comments:  I Julianne Rice certify that this patient is under my care and that I, or a nurse practitioner or physician's assistant working with me, had a face-to-face encounter that meets the physician face-to-face encounter requirements with this patient on 05/28/2017. The encounter with the patient was in whole, or in part for the following medical condition(s) which is the primary reason for home health care (List medical condition): Chronic neck and back pain limiting mobility   05/28/17 1720       Julianne Rice, MD 05/29/17 2137

## 2017-05-28 NOTE — Telephone Encounter (Signed)
Patient daughter states she already left a message on the nurse line.  Patient is feeling weak (cant write his name or get out of chair) , tingling/numbness in hands, swelling in feet & lower legs, pain in back & neck. Diabetes is 312 this morning.  Please contact spouse [phone]

## 2017-05-31 ENCOUNTER — Encounter: Payer: Self-pay | Admitting: Family Medicine

## 2017-06-01 NOTE — Telephone Encounter (Signed)
Please check on the status of the PT referral. It was supposed to be a home health referral and I have placed it several times. Please assist his daughter in getting the services set up. Her name is Nunzio Cory. See the mychart message from her.

## 2017-06-02 NOTE — Telephone Encounter (Signed)
Please see message and check on PT referral. Gwen Her. Mannie Stabile, MD

## 2017-06-03 ENCOUNTER — Ambulatory Visit (HOSPITAL_COMMUNITY): Payer: Medicare Other

## 2017-06-08 ENCOUNTER — Telehealth: Payer: Self-pay

## 2017-06-08 NOTE — Telephone Encounter (Signed)
Called [facility] regarding in home health referral placed by Dr.Hagler for PT for patient. Tisa stated they hadn't received the order and they have to have that order. I printed the order and requested she stay on the phone while I faxed the order to her. She verified she had received the order and it went to [employer]. He would pull the order and start working on it. I ask her to make sure he called Nunzio Cory and gave her the number. I also asked that if for any reason he was unable to reach her that he please call me back and let me know so that I may reach out to the daughter since this has been going on for several weeks and we have been trying to get this scheduled for this patient. She took down all of the information.

## 2017-06-09 ENCOUNTER — Telehealth: Payer: Self-pay | Admitting: Family Medicine

## 2017-06-09 ENCOUNTER — Encounter: Payer: Self-pay | Admitting: Family Medicine

## 2017-06-09 NOTE — Telephone Encounter (Signed)
Kendra from [facility] called to let you know this patients imaging needs precert.  [phone] (call back with auth #)

## 2017-06-10 ENCOUNTER — Ambulatory Visit (HOSPITAL_COMMUNITY): Payer: Medicare Other

## 2017-06-10 ENCOUNTER — Other Ambulatory Visit: Payer: Self-pay | Admitting: Family Medicine

## 2017-06-10 DIAGNOSIS — F0280 Dementia in other diseases classified elsewhere without behavioral disturbance: Secondary | ICD-10-CM

## 2017-06-10 DIAGNOSIS — G301 Alzheimer's disease with late onset: Principal | ICD-10-CM

## 2017-06-10 DIAGNOSIS — R296 Repeated falls: Secondary | ICD-10-CM

## 2017-06-10 DIAGNOSIS — F321 Major depressive disorder, single episode, moderate: Secondary | ICD-10-CM

## 2017-06-10 DIAGNOSIS — R269 Unspecified abnormalities of gait and mobility: Secondary | ICD-10-CM

## 2017-06-10 NOTE — Telephone Encounter (Signed)
Referral replaced again and celexa refilled. Please make sure that home health gets the referral. Thanks.

## 2017-06-11 ENCOUNTER — Ambulatory Visit (INDEPENDENT_AMBULATORY_CARE_PROVIDER_SITE_OTHER): Payer: Medicare Other | Admitting: Family Medicine

## 2017-06-11 ENCOUNTER — Other Ambulatory Visit: Payer: Self-pay

## 2017-06-11 ENCOUNTER — Encounter: Payer: Self-pay | Admitting: Family Medicine

## 2017-06-11 VITALS — BP 140/56 | HR 66 | Temp 98.3°F | Resp 12 | Ht 63.0 in | Wt 137.1 lb

## 2017-06-11 DIAGNOSIS — F0280 Dementia in other diseases classified elsewhere without behavioral disturbance: Secondary | ICD-10-CM | POA: Diagnosis not present

## 2017-06-11 DIAGNOSIS — G309 Alzheimer's disease, unspecified: Secondary | ICD-10-CM

## 2017-06-11 DIAGNOSIS — E782 Mixed hyperlipidemia: Secondary | ICD-10-CM | POA: Diagnosis not present

## 2017-06-11 DIAGNOSIS — D649 Anemia, unspecified: Secondary | ICD-10-CM

## 2017-06-11 DIAGNOSIS — E1165 Type 2 diabetes mellitus with hyperglycemia: Secondary | ICD-10-CM | POA: Diagnosis not present

## 2017-06-11 DIAGNOSIS — E118 Type 2 diabetes mellitus with unspecified complications: Secondary | ICD-10-CM

## 2017-06-11 DIAGNOSIS — J9801 Acute bronchospasm: Secondary | ICD-10-CM | POA: Diagnosis not present

## 2017-06-11 DIAGNOSIS — R0982 Postnasal drip: Secondary | ICD-10-CM

## 2017-06-11 DIAGNOSIS — E039 Hypothyroidism, unspecified: Secondary | ICD-10-CM | POA: Diagnosis not present

## 2017-06-11 DIAGNOSIS — R609 Edema, unspecified: Secondary | ICD-10-CM

## 2017-06-11 DIAGNOSIS — IMO0002 Reserved for concepts with insufficient information to code with codable children: Secondary | ICD-10-CM

## 2017-06-11 MED ORDER — LINAGLIPTIN 5 MG PO TABS
5.0000 mg | ORAL_TABLET | Freq: Every day | ORAL | 2 refills | Status: DC
Start: 2017-06-11 — End: 2017-09-02

## 2017-06-11 MED ORDER — LEVOTHYROXINE SODIUM 50 MCG PO TABS: 50.0000 ug | ORAL_TABLET | Freq: Every day | ORAL | 2 refills | Status: AC

## 2017-06-11 MED ORDER — GLIPIZIDE ER 5 MG PO TB24: ORAL_TABLET | ORAL | 2 refills | Status: DC

## 2017-06-11 MED ORDER — LORATADINE 10 MG PO TABS: 10.0000 mg | ORAL_TABLET | Freq: Every day | ORAL | 11 refills | Status: DC

## 2017-06-11 MED ORDER — ALBUTEROL SULFATE HFA 108 (90 BASE) MCG/ACT IN AERS
1.0000 | INHALATION_SPRAY | Freq: Four times a day (QID) | RESPIRATORY_TRACT | 0 refills | Status: DC | PRN
Start: 2017-06-11 — End: 2017-07-12

## 2017-06-11 NOTE — Progress Notes (Signed)
Jonathon Miller ID: Jonathon Miller, male    DOB: [birth year], 84 y.o.   MRN: [id number]  Chief Complaint  Jonathon Miller presents with  . Depression    2 week follow up  . Edema    Allergies Penicillins  Subjective:   Jonathon Miller is a 84 y.o. male who presents to [facility] today.  HPI Jonathon Miller presents for an office visit today.  Jonathon Miller is accompanied by Jonathon Miller wife and daughter to the visit today.  Since Jonathon Miller has been seen here last Jonathon Miller was seen in the emergency department and diagnosed with bronchitis.  Jonathon Miller had some labs done at that time which did reveal a low hemoglobin.  Jonathon Miller reports that Jonathon Miller does still feels some congestion in Jonathon Miller chest.  Jonathon Miller reports that Jonathon Miller does not feel short of breath.  Occasionally feels like Jonathon Miller has to cough and clear Jonathon Miller chest.  Believes Jonathon Miller might have some drainage down the back of Jonathon Miller throat.  Has never lived in this area before and has never experienced seasonal allergies.  Does feel occasional nasal congestion.  No sinus congestion.  No fevers, nausea, vomiting, or diarrhea.  Jonathon Miller has been taking the Celexa 10 mg a day.  Family is not sure but may be some improvement in Jonathon Miller mood.  Jonathon Miller was increased to 20 mg a day.  Jonathon Miller denies any suicidal or homicidal ideations.  Jonathon Miller is currently undergoing a work-up for dementia.  Family denies any behavioral disturbance at home.  No auditory or visual hallucinations per Jonathon Miller.  Sleeping fair.  Is sitting and not being as active most of the day.  Wife reports that Jonathon Miller can be very negative and have a down mood.  Mood decreased after moving here from [place] and leaving all Jonathon Miller friends.  Referral for physical therapy was placed quite some time ago but have had troubles with [facility] and getting this set up.  Daughter reports that this is now been taken care of.  Jonathon Miller will be receiving home-based physical therapy due to Jonathon Miller gait instability.  Jonathon Miller has not had any recent falls.  Jonathon Miller gait is unsteady.  Jonathon Miller denies any vertiginous  symptoms.  Jonathon Miller is still waiting to get the MRI performed.  Jonathon Miller has been delayed due to approval from [facility] in [place].  It is been scheduled several times and then rescheduled due to payment.  Jonathon Miller does not believe Jonathon Miller has a diagnosis of dementia.  This is been brought up multiple times by Jonathon Miller previous physicians and Jonathon Miller is refused acknowledgment.  Most of Jonathon Miller family is also been in denial and believe that Jonathon Miller memory disturbances secondary to Jonathon Miller age.  Jonathon Miller stepdaughter advocates for medication but Jonathon Miller other family members do not.  They report that Jonathon Miller does repetitive behaviors throughout the day.  Jonathon Miller might check the oil in the car or do the same activity multiple times in a row without being able to remember that Jonathon Miller is just completed it.  Jonathon Miller asked the same questions multiple times.  Jonathon Miller is forgetful.  Jonathon Miller does not cook.  Jonathon Miller does not smoke.  Jonathon Miller is not verbally abusive.  Jonathon Miller does not wander.  Jonathon Miller wife reports that Jonathon Miller has had some swelling in Jonathon Miller legs over the past couple days.  This is happened in the past.  It is not painful.  It usually occurs when Jonathon Miller sits for long periods of time and is not as active.  It can also occur if  they have had a lot of salt.  Jonathon Miller did eat [place] fried chicken yesterday.  Jonathon Miller is not in pain.  Jonathon Miller is not aware that Jonathon Miller has any swelling.  Jonathon Miller nor Jonathon Miller family wish for him to have any compression hose.   Past Medical History:  Diagnosis Date  . Alzheimer's dementia   . Anemia of chronic disease 03/18/2017  . Cancer (HCC)    Skin  . Cataract    skin  . CRD (chronic renal disease), stage III ([place]) 03/18/2017  . Diabetes mellitus without complication ([place])   . Gastritis   . Hyperlipidemia   . Hypertension   . Myocardial infarction ([place])   . Thyroid disease     Past Surgical History:  Procedure Laterality Date  . APPENDECTOMY    . EYE SURGERY      Family History  Problem Relation Age of Onset  . Diabetes Other      Social History   Socioeconomic History    . Marital status: Married    Spouse name: [name]  . Number of children: 3  . Years of education: 39  . Highest education level: High school graduate  Occupational History  . 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 Occupation: Child psychotherapist  . 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 Financial resource strain: Not hard at all  . Food insecurity:    Worry: Never true    Inability: Never true  . Transportation needs:    Medical: No    Non-medical: No  Tobacco Use  . Smoking status: Never Smoker  . Smokeless tobacco: Never Used  Substance and Sexual Activity  . Alcohol use: No    Frequency: Never  . Drug use: No  . Sexual activity: Never  Lifestyle  . Physical activity:    Days per week: 0 days    Minutes per session: 0 min  . Stress: To some extent  Relationships  . Social connections:    Talks on phone: Once a week    Gets together: More than three times a week    Attends religious service: Never    Active member of club or organization: No    Attends meetings of clubs or organizations: Never    Relationship status: Married  Other Topics Concern  . Not on file  Social History Narrative   Just moved to [place] with wife [name], wife of 24 years (second)   Lives with Daughter [name] who helps care for them   Likes to Golf-not able anymore   Deer Hunting-not able anymore   Prior occupation was baking/cooking       Review of Systems  Constitutional: Negative for appetite change, chills, fever and unexpected weight change.  HENT: Positive for postnasal drip. Negative for trouble swallowing and voice change.   Eyes: Negative for visual disturbance.  Respiratory: Negative for cough, chest tightness, shortness of breath and wheezing.   Cardiovascular: Negative for chest pain, palpitations and leg swelling.  Gastrointestinal: Negative for abdominal pain, diarrhea, nausea and vomiting.  Genitourinary: Negative for decreased urine volume, dysuria and  frequency.  Musculoskeletal: Negative for arthralgias, myalgias and neck pain.  Skin: Negative for rash.  Neurological: Negative for dizziness, tremors, syncope, facial asymmetry, weakness and headaches.  Hematological: Negative for adenopathy. Does not bruise/bleed easily.  Psychiatric/Behavioral: Positive for dysphoric mood. Negative for agitation, behavioral problems, sleep disturbance and suicidal ideas. The Jonathon Miller is not nervous/anxious.     Objective:   BP [name])  140/56 (BP Location: Left Arm, Jonathon Miller Position: Sitting, Cuff Size: Normal)   Pulse 66   Temp 98.3 F (36.8 C) (Temporal)   Resp 12   Ht 5\' 3"  (1.6 m)   Wt 137 lb 1.9 oz (62.2 kg)   SpO2 97%   BMI 24.29 kg/m   Physical Exam  Constitutional: Jonathon Miller is oriented to person, place, and time. Jonathon Miller appears well-developed and well-nourished.  HENT:  Head: Normocephalic and atraumatic.  Eyes: Pupils are equal, round, and reactive to light. EOM are normal.  Neck: Normal range of motion. Neck supple. No JVD present. No thyromegaly present.  Cardiovascular: Normal rate and regular rhythm.  No extrasystoles are present.  Pulmonary/Chest: Effort normal and breath sounds normal. No accessory muscle usage. No respiratory distress. Jonathon Miller has no decreased breath sounds.  Lymphadenopathy:    Jonathon Miller has no cervical adenopathy.  Neurological: Jonathon Miller is alert and oriented to person, place, and time. Jonathon Miller is not disoriented.  Skin: Skin is warm and dry. Capillary refill takes less than 2 seconds.  Psychiatric: Jonathon Miller mood appears not anxious. Jonathon Miller affect is blunt. Jonathon Miller is slowed and withdrawn. Jonathon Miller is not agitated. Cognition and memory are impaired. Jonathon Miller exhibits a depressed mood. Jonathon Miller expresses no homicidal and no suicidal ideation. Jonathon Miller expresses no suicidal plans and no homicidal plans. Jonathon Miller exhibits abnormal recent memory and abnormal remote memory.  Jonathon Miller sits in chair and stairs forward.  Jonathon Miller face is not very animated.  Jonathon Miller does smile at the end of the visit.   Jonathon Miller  mood appears blunted.  Jonathon Miller thought processes are difficult to judge because Jonathon Miller does have a degree of dementia.  Jonathon Miller is hard of hearing and questions are repeated multiple times.  Jonathon Miller immediate, recent, and remote recall are not within normal limits.  No suicidal or homicidal ideations.  No auditory or visual hallucinations.  Vitals reviewed.  Depression screen [facility] 2/9 06/11/2017 03/17/2017  Decreased Interest 2 1  Down, Depressed, Hopeless 2 3  PHQ - 2 Score 4 4  Altered sleeping 0 2  Tired, decreased energy 3 -  Change in appetite 0 3  Feeling bad or failure about yourself  2 0  Trouble concentrating 2 3  Moving slowly or fidgety/restless 0 0  Suicidal thoughts 0 0  PHQ-9 Score 11 12  Difficult doing work/chores Not difficult at all -   MMSE - Mini Mental State Exam 03/17/2017  Orientation to time 3  Orientation to Place 2  Registration 1  Attention/ Calculation 5  Recall 0  Language- name 2 objects 2  Language- repeat 1  Language- follow 3 step command 3  Language- read & follow direction 1  Write a sentence 1  Copy design 0  Total score 19   She was given an albuterol/ipratropium nebulizer while in the office today.  Jonathon Miller did not have any change in Jonathon Miller symptoms.  There was no wheezing in Jonathon Miller lung fields before or after the treatment.  There was no change in Jonathon Miller breath sounds before or after the treatment. Assessment and Plan  1. Uncontrolled type 2 diabetes mellitus with complication [facility]) Jonathon Miller last had Jonathon Miller hemoglobin A1c checked on 03/17/2017.  It was elevated at that time but Jonathon Miller been off of Jonathon Miller medications.  Jonathon Miller sugars have been running better at home.  We will plan for him to come in and check Jonathon Miller blood sugars on Monday including Jonathon Miller hemoglobin A1c.  Will await to refill Jonathon Miller other medications at that time in case we  need to adjust the dosing. - linagliptin (TRADJENTA) 5 MG TABS tablet; Take 1 tablet (5 mg total) by mouth daily.  Dispense: 30 tablet; Refill: 2 - Hemoglobin I6N -  Basic metabolic panel  2. Hypothyroidism, unspecified type Any thyroid medication as directed. - levothyroxine (SYNTHROID) 50 MCG tablet; Take 1 tablet (50 mcg total) by mouth daily before breakfast.  Dispense: 30 tablet; Refill: 2  3. Mixed hyperlipidemia Stable.  Continue medication.  4. Bronchospasm Jonathon Miller has had a history of bronchospasm and used an inhaler when Jonathon Miller was sick.  Jonathon Miller denies any wheezing, cough, or shortness of breath.  I do not believe that Jonathon Miller is indicated to use this at this time but they wish to have a prescription for this at home. - albuterol (PROVENTIL HFA;VENTOLIN HFA) 108 (90 Base) MCG/ACT inhaler; Inhale 1 puff into the lungs every 6 (six) hours as needed for wheezing or shortness of breath.  Dispense: 1 Inhaler; Refill: 0 I believe that Jonathon Miller current symptoms are related to postnasal drip.  Will try Claritin over-the-counter as directed.  Jonathon Miller lungs sound clear.  Jonathon Miller chest x-ray was reviewed from the emergency department visit.  5. Anemia, unspecified type Review of labs from the emergency department reveal hemoglobin of approximately 9.5.  This is a decrease in Jonathon Miller hemoglobin from approximately a month or 2 ago.  Jonathon Miller denies any symptoms which would be consistent of blood loss.  Will check labs. - CBC with Differential/Platelet - Iron, TIBC and Ferritin Panel  6. Post-nasal drip Suspect secondary to seasonal allergies.  Suspect this is the cause of Jonathon Miller's clearing Jonathon Miller throat and as Jonathon Miller describes congestion. - loratadine (CLARITIN) 10 MG tablet; Take 1 tablet (10 mg total) by mouth daily.  Dispense: 30 tablet; Refill: 11  7.  Dementia Jonathon Miller has previously refused all treatment for dementia.   This is a discussion which is been ongoing with Jonathon Miller children and Jonathon Miller wife for quite some time.  Jonathon Miller does not believe nor is Jonathon Miller ever believe that Jonathon Miller has a diagnosis of dementia.  We are awaiting approval of the MRI for evaluation.  Jonathon Miller laboratory work-up of dementia has been  completed.  They are not interested in starting any medications at this time.  Do believe that Jonathon Miller mood could be contributing to Jonathon Miller dementia.  However Jonathon Miller possibly could have a neurodegenerative condition causing some changes in Jonathon Miller memory.  Jonathon Miller is 84 years old.  Jonathon Miller is not interested in taking a medication at this time.  Jonathon Miller wife and daughter would like to have this MRI performed.  They defer referral to neurology for evaluation at this time.  We did discuss the medications used to treat dementia.  Will await study.  8.  Edema I do recommend trying lifestyle modifications prior to initiating another medication to deal with this edema.  I suspect it is due to salt intake and keeping Jonathon Miller legs in the downward position most of the day.  Jonathon Miller was encouraged to be more active.  Elevate legs.  Decrease salt intake.  They were counseled concerning worrisome signs and symptoms of edema and if those develop to let us know.  They defer any compression hose. Return in about 1 month (around 07/09/2017) for follow up. [name], MD 06/11/2017

## 2017-06-14 ENCOUNTER — Telehealth (HOSPITAL_COMMUNITY): Payer: Self-pay | Admitting: Family Medicine

## 2017-06-14 NOTE — Telephone Encounter (Signed)
wife called and cancelled said he just wasn't going to do the outpatient therapy - I did ask if they wanted to reschedule   06/14/17

## 2017-06-15 ENCOUNTER — Ambulatory Visit (HOSPITAL_COMMUNITY): Payer: Medicare Other

## 2017-06-15 LAB — CBC WITH DIFFERENTIAL/PLATELET
BASOS ABS: 68 {cells}/uL (ref 0–200)
Basophils Relative: 0.6 %
Eosinophils Absolute: 158 cells/uL (ref 15–500)
Eosinophils Relative: 1.4 %
HCT: 30.8 % — ABNORMAL LOW (ref 38.5–50.0)
HEMOGLOBIN: 10.1 g/dL — AB (ref 13.2–17.1)
Lymphs Abs: 1458 cells/uL (ref 850–3900)
MCH: 28.6 pg (ref 27.0–33.0)
MCHC: 32.8 g/dL (ref 32.0–36.0)
MCV: 87.3 fL (ref 80.0–100.0)
MONOS PCT: 6 %
MPV: 9.6 fL (ref 7.5–12.5)
NEUTROS ABS: 8938 {cells}/uL — AB (ref 1500–7800)
Neutrophils Relative %: 79.1 %
Platelets: 393 10*3/uL (ref 140–400)
RBC: 3.53 10*6/uL — ABNORMAL LOW (ref 4.20–5.80)
RDW: 12.9 % (ref 11.0–15.0)
TOTAL LYMPHOCYTE: 12.9 %
WBC: 11.3 10*3/uL — ABNORMAL HIGH (ref 3.8–10.8)
WBCMIX: 678 {cells}/uL (ref 200–950)

## 2017-06-15 LAB — BASIC METABOLIC PANEL
BUN / CREAT RATIO: 15 (calc) (ref 6–22)
BUN: 20 mg/dL (ref 7–25)
CHLORIDE: 97 mmol/L — AB (ref 98–110)
CO2: 29 mmol/L (ref 20–32)
CREATININE: 1.37 mg/dL — AB (ref 0.70–1.11)
Calcium: 9.5 mg/dL (ref 8.6–10.3)
Glucose, Bld: 219 mg/dL — ABNORMAL HIGH (ref 65–139)
Potassium: 4.4 mmol/L (ref 3.5–5.3)
Sodium: 134 mmol/L — ABNORMAL LOW (ref 135–146)

## 2017-06-15 LAB — HEMOGLOBIN A1C
EAG (MMOL/L): 13.5 (calc)
HEMOGLOBIN A1C: 10.1 %{Hb} — AB (ref <5.7)
Mean Plasma Glucose: 243 (calc)

## 2017-06-15 LAB — IRON,TIBC AND FERRITIN PANEL
%SAT: 20 % (calc) (ref 15–60)
FERRITIN: 480 ng/mL — AB (ref 20–380)
Iron: 58 ug/dL (ref 50–180)
TIBC: 295 mcg/dL (calc) (ref 250–425)

## 2017-06-15 NOTE — Telephone Encounter (Signed)
Patient was precerted and approved.

## 2017-06-15 NOTE — Telephone Encounter (Signed)
Patient scheduled and daughter notified with appt info with verbal understanding.

## 2017-06-16 ENCOUNTER — Other Ambulatory Visit: Payer: Self-pay | Admitting: Family Medicine

## 2017-06-16 DIAGNOSIS — F0390 Unspecified dementia without behavioral disturbance: Secondary | ICD-10-CM

## 2017-06-16 DIAGNOSIS — G309 Alzheimer's disease, unspecified: Secondary | ICD-10-CM

## 2017-06-16 DIAGNOSIS — F0280 Dementia in other diseases classified elsewhere without behavioral disturbance: Secondary | ICD-10-CM

## 2017-06-16 DIAGNOSIS — G301 Alzheimer's disease with late onset: Principal | ICD-10-CM

## 2017-06-16 NOTE — Progress Notes (Signed)
Order placed for MRI brain.  

## 2017-06-17 ENCOUNTER — Ambulatory Visit (HOSPITAL_COMMUNITY): Payer: Medicare Other

## 2017-06-17 ENCOUNTER — Ambulatory Visit (HOSPITAL_COMMUNITY)
Admission: RE | Admit: 2017-06-17 | Discharge: 2017-06-17 | Disposition: A | Discharge status: Home / Self Care | Payer: Medicare Other | Source: Ambulatory Visit | Attending: Family Medicine | Admitting: Family Medicine

## 2017-06-17 DIAGNOSIS — G309 Alzheimer's disease, unspecified: Secondary | ICD-10-CM | POA: Insufficient documentation

## 2017-06-17 DIAGNOSIS — F0390 Unspecified dementia without behavioral disturbance: Secondary | ICD-10-CM | POA: Insufficient documentation

## 2017-06-18 ENCOUNTER — Encounter: Payer: Self-pay | Admitting: Family Medicine

## 2017-06-18 ENCOUNTER — Telehealth: Payer: Self-pay | Admitting: Family Medicine

## 2017-06-18 NOTE — Telephone Encounter (Signed)
Opened in error. Brittnei Jagiello H. Satomi Buda, MD  

## 2017-06-18 NOTE — Telephone Encounter (Signed)
Please call patient's daughter-in-law, Nunzio Cory, and advise that patient's blood sugars are running high.  His hemoglobin A1c was slightly higher than it was in the past.  Initially 3 months ago his hemoglobin A1c was 9.5.  His hemoglobin A1c is now 10.1.  We thought that 3 months ago his A1c was elevated due to the fact that he had not been compliant with his medications.  However his hemoglobin A1c is even higher at this time.  I need for them to try to cut down on sugar.  We will need to discuss options for blood sugar management at the next visit.  He is limited regarding oral medications that he can take due to his age and kidney function.  However, I also have concerns about him administering insulin or his wife administering insulin due to their age/mental status. Please advise her that his MRI did not reveal any intracranial abnormalities.  He does have evidence of advanced generalized atrophy or brain volume loss.  We can discuss this in more detail at the follow-up office visit.  Please schedule office visit within the next several weeks.

## 2017-06-22 NOTE — Telephone Encounter (Signed)
Daughter in law is aware 

## 2017-06-27 ENCOUNTER — Encounter: Payer: Self-pay | Admitting: Family Medicine

## 2017-07-01 ENCOUNTER — Encounter: Payer: Self-pay | Admitting: Family Medicine

## 2017-07-02 ENCOUNTER — Encounter: Payer: Self-pay | Admitting: Family Medicine

## 2017-07-04 ENCOUNTER — Encounter: Payer: Self-pay | Admitting: Family Medicine

## 2017-07-04 DIAGNOSIS — E118 Type 2 diabetes mellitus with unspecified complications: Principal | ICD-10-CM

## 2017-07-04 DIAGNOSIS — IMO0002 Reserved for concepts with insufficient information to code with codable children: Secondary | ICD-10-CM

## 2017-07-04 DIAGNOSIS — E1165 Type 2 diabetes mellitus with hyperglycemia: Secondary | ICD-10-CM

## 2017-07-06 MED ORDER — GLIPIZIDE ER 2.5 MG PO TB24: 2.5000 mg | ORAL_TABLET | Freq: Every day | ORAL | 0 refills | Status: DC

## 2017-07-06 NOTE — Addendum Note (Signed)
Addended by: [name] on: 07/06/2017 05:45 PM   Modules accepted: Orders

## 2017-07-12 ENCOUNTER — Other Ambulatory Visit: Payer: Self-pay

## 2017-07-12 DIAGNOSIS — J9801 Acute bronchospasm: Secondary | ICD-10-CM

## 2017-07-12 MED ORDER — ALBUTEROL SULFATE HFA 108 (90 BASE) MCG/ACT IN AERS: 1.0000 | INHALATION_SPRAY | Freq: Four times a day (QID) | RESPIRATORY_TRACT | 0 refills | Status: DC | PRN

## 2017-07-13 ENCOUNTER — Other Ambulatory Visit: Payer: Self-pay | Admitting: Family Medicine

## 2017-07-13 DIAGNOSIS — J9801 Acute bronchospasm: Secondary | ICD-10-CM

## 2017-07-22 ENCOUNTER — Ambulatory Visit: Payer: Medicare Other | Admitting: Family Medicine

## 2017-07-30 ENCOUNTER — Other Ambulatory Visit: Payer: Self-pay | Admitting: Family Medicine

## 2017-07-30 ENCOUNTER — Ambulatory Visit: Payer: Medicare Other | Admitting: Family Medicine

## 2017-08-08 ENCOUNTER — Other Ambulatory Visit: Payer: Self-pay | Admitting: Family Medicine

## 2017-08-10 ENCOUNTER — Other Ambulatory Visit: Payer: Self-pay

## 2017-08-10 ENCOUNTER — Ambulatory Visit (INDEPENDENT_AMBULATORY_CARE_PROVIDER_SITE_OTHER): Payer: Medicare Other | Admitting: Family Medicine

## 2017-08-10 ENCOUNTER — Encounter: Payer: Self-pay | Admitting: Family Medicine

## 2017-08-10 VITALS — BP 140/62 | HR 67 | Temp 98.5°F | Resp 12 | Ht 60.0 in | Wt 143.0 lb

## 2017-08-10 DIAGNOSIS — E1165 Type 2 diabetes mellitus with hyperglycemia: Secondary | ICD-10-CM

## 2017-08-10 DIAGNOSIS — E118 Type 2 diabetes mellitus with unspecified complications: Secondary | ICD-10-CM | POA: Diagnosis not present

## 2017-08-10 DIAGNOSIS — F0390 Unspecified dementia without behavioral disturbance: Secondary | ICD-10-CM

## 2017-08-10 DIAGNOSIS — IMO0002 Reserved for concepts with insufficient information to code with codable children: Secondary | ICD-10-CM

## 2017-08-10 NOTE — Progress Notes (Signed)
Patient ID: Jonathon Miller, male    DOB: 10/04/1925, 84 y.o.   MRN: [id number]  Chief Complaint  Patient presents with  . Follow-up    Allergies Penicillins  Subjective:   Jonathon Miller is a 84 y.o. male who presents to [facility] today.  HPI Jonathon Miller presents today for an office visit.  He is accompanied by his wife and stepdaughter for the visit.  They are here to discuss the possibility of moving into an assisted living situation.  Jonathon Miller has previously lived most of his life in [place] and [place] and recently moved to this area less than 1 year ago due to declining health status.  He has a long-standing history of dementia and trouble with his memory.  He does not wander.  He has had several falls.  He does not administer his own medication.  His medications are given to him by his wife.  His appetite is been good.  He is completing some physical therapy at this time for balance and gait stability.  He does have a history of diabetes.  His wife reports that he will forget that he is eaten and not long after completing a meal will go and eat another meal.  He is on some medications orally for his diabetes.  He has never been seen by an endocrinologist.  He does not drive.  Appetite is good.  He is taking all of his medications.  Denies any chest pain, shortness of breath, or swelling in his lower extremities.  No recent falls over the past month or so.  Has had several emergency department visits during his stay here in [place].  Denies any hypoglycemic episodes.  They are checking his blood pressure and blood sugars at home.  His sugars have been running in the 200s.  He does not want any medication for his memory.  Reportedly he has refused medication for his memory for many years.  He reports that he misses his friends.  He does not like living here per his report.  Reports that he feels bored most of the time.     Past Medical History:  Diagnosis  Date  . Alzheimer's dementia   . Anemia of chronic disease 03/18/2017  . Cancer (HCC)    Skin  . Cataract    skin  . CRD (chronic renal disease), stage III ([place]) 03/18/2017  . Diabetes mellitus without complication ([place])   . Gastritis   . Hyperlipidemia   . Hypertension   . Myocardial infarction ([place])   . Thyroid disease     Past Surgical History:  Procedure Laterality Date  . APPENDECTOMY    . EYE SURGERY      Family History  Problem Relation Age of Onset  . Diabetes Other      Social History   Socioeconomic History  . Marital status: Married    Spouse name: [name]  . Number of children: 3  . Years of education: 39  . Highest education level: High school graduate  Occupational History  . 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 Occupation: Child psychotherapist  . 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 Financial resource strain: Not hard at all  . Food insecurity:    Worry: Never true    Inability: Never true  . Transportation needs:    Medical: No    Non-medical: No  Tobacco Use  . Smoking status: Never Smoker  . Smokeless tobacco: Never  Used  Substance and Sexual Activity  . Alcohol use: No    Frequency: Never  . Drug use: No  . Sexual activity: Never  Lifestyle  . Physical activity:    Days per week: 0 days    Minutes per session: 0 min  . Stress: To some extent  Relationships  . Social connections:    Talks on phone: Once a week    Gets together: More than three times a week    Attends religious service: Never    Active member of club or organization: No    Attends meetings of clubs or organizations: Never    Relationship status: Married  Other Topics Concern  . Not on file  Social History Narrative   Just moved to [place] with wife [name], wife of 24 years (second)   Lives with Daughter Jonathon Miller who helps care for them   Likes to Golf-not able anymore   Deer Hunting-not able anymore   Prior occupation was baking/cooking        Review of Systems  Constitutional: Negative for chills, diaphoresis, fatigue, fever and unexpected weight change.  Respiratory: Negative for cough, shortness of breath and wheezing.   Cardiovascular: Negative for chest pain, palpitations and leg swelling.  Gastrointestinal: Negative for abdominal pain and constipation.  Psychiatric/Behavioral: Negative for decreased concentration, dysphoric mood and suicidal ideas.   Current Outpatient Medications on File Prior to Visit  Medication Sig Dispense Refill  . albuterol (PROVENTIL HFA;VENTOLIN HFA) 108 (90 Base) MCG/ACT inhaler Inhale 1 puff into the lungs every 6 (six) hours as needed for wheezing or shortness of breath. 1 Inhaler 0  . aspirin EC 81 MG tablet Take 81 mg by mouth daily.    . citalopram (CELEXA) 20 MG tablet Take 1 tablet (20 mg total) by mouth daily. 90 tablet 1  . famotidine (PEPCID) 20 MG tablet Take 1 tablet (20 mg total) by mouth 2 (two) times daily. 30 tablet 0  . glipiZIDE (GLUCOTROL XL) 2.5 MG 24 hr tablet TAKE 1 TABLET (2.5 MG TOTAL) BY MOUTH DAILY WITH BREAKFAST. 30 tablet 0  . levothyroxine (SYNTHROID) 50 MCG tablet Take 1 tablet (50 mcg total) by mouth daily before breakfast. 30 tablet 2  . linagliptin (TRADJENTA) 5 MG TABS tablet Take 1 tablet (5 mg total) by mouth daily. 30 tablet 2  . loratadine (CLARITIN) 10 MG tablet Take 1 tablet (10 mg total) by mouth daily. 30 tablet 11  . nitroGLYCERIN (NITROSTAT) 0.4 MG SL tablet Place 0.4 mg under the tongue every 5 (five) minutes as needed for chest pain.    . pioglitazone (ACTOS) 15 MG tablet Take 15 mg by mouth daily.    . vitamin C (ASCORBIC ACID) 500 MG tablet Take 500 mg by mouth daily.     No current facility-administered medications on file prior to visit.      Objective:   BP 140/62 (BP Location: Left Arm, Patient Position: Sitting, Cuff Size: Normal)   Pulse 67   Temp 98.5 F (36.9 C) (Temporal)   Resp 12   Ht 5' (1.524 m)   Wt 143 lb (64.9 kg)   SpO2  97%   BMI 27.93 kg/m   Physical Exam  Constitutional: He appears well-developed and well-nourished.  Cardiovascular: Normal rate and regular rhythm.  Pulmonary/Chest: Effort normal and breath sounds normal.  Abdominal: Soft. Bowel sounds are normal.  Skin: Skin is warm and dry.  Psychiatric: He has a normal mood and affect.  Vitals reviewed.  Depression screen [facility] 2/9 08/10/2017 06/11/2017 03/17/2017  Decreased Interest 3 2 1   Down, Depressed, Hopeless 0 2 3  PHQ - 2 Score 3 4 4   Altered sleeping 3 0 2  Tired, decreased energy 1 3 -  Change in appetite 0 0 3  Feeling bad or failure about yourself  0 2 0  Trouble concentrating 0 2 3  Moving slowly or fidgety/restless 0 0 0  Suicidal thoughts 0 0 0  PHQ-9 Score 7 11 12   Difficult doing work/chores Not difficult at all Not difficult at all -    Assessment and Plan  1. Uncontrolled type 2 diabetes mellitus with complication [facility]) Refer to endocrinology to get opinion regarding management of diabetes.  Patient is limited due to medications due to finances, age, and renal function.  I do not believe it is safe for him to be on insulin at home due to his dementia and his wife's age/possible dementia. - Ambulatory referral to Endocrinology  Long discussion with patient, his wife, and his stepdaughter today regarding living situation.  I discussed with him in detail that I do not believe that her living situation is optimal.  I believe that he is a fall risk and that they need a higher level of care.  I believe that an assisted living situation would be beneficial because it would give him relief from cooking and handling daily life activities.  In addition it would give him assistance with medication.  Initially, patient was very hesitant to this, however he does admit that he is not happy in this area and he has no friends.  His wife and stepdaughter are going to discuss possible move to an assisted living in [place] versus [place].  Safety  precautions were given.  Wife and daughter will continue to help with medication administration.  Counseling was given.  Office visit was greater than 25 minutes.  Greater than 50% was spent counseling and coordinating care.  Continue all current medications.  Follow-up in 1 to 2 months or sooner if needed.  Call with any questions or concerns. Return in about 2 months (around 10/11/2017) for follow up. [name], MD 08/10/2017

## 2017-08-12 ENCOUNTER — Telehealth: Payer: Self-pay | Admitting: Family Medicine

## 2017-08-12 NOTE — Telephone Encounter (Signed)
PT is calling and they need a new order last order from may--for PT--fax to [phone]

## 2017-08-13 NOTE — Telephone Encounter (Signed)
PT is calling back regarding the Orders. They cant see the patient until they receive the order

## 2017-08-14 NOTE — Telephone Encounter (Signed)
Please give verbal order or have them send something for me to sign. Gwen Her. Mannie Stabile, MD

## 2017-08-16 ENCOUNTER — Ambulatory Visit: Payer: Medicare Other | Admitting: "Endocrinology

## 2017-08-16 ENCOUNTER — Encounter: Payer: Self-pay | Admitting: "Endocrinology

## 2017-08-16 VITALS — BP 133/72 | HR 69 | Ht 63.0 in | Wt 143.0 lb

## 2017-08-16 DIAGNOSIS — E039 Hypothyroidism, unspecified: Secondary | ICD-10-CM | POA: Diagnosis not present

## 2017-08-16 DIAGNOSIS — N183 Chronic kidney disease, stage 3 (moderate): Secondary | ICD-10-CM

## 2017-08-16 DIAGNOSIS — E1122 Type 2 diabetes mellitus with diabetic chronic kidney disease: Secondary | ICD-10-CM | POA: Diagnosis not present

## 2017-08-16 MED ORDER — GLIPIZIDE ER 5 MG PO TB24: 5.0000 mg | ORAL_TABLET | Freq: Every day | ORAL | 2 refills | Status: DC

## 2017-08-16 NOTE — Patient Instructions (Signed)
                                             Additional Care Considerations for Diabetes   -Diabetes he is a chronic disease.  The most important care consideration is regular follow-up with your diabetes care provider with the goal being avoiding or delaying its complications and to take advantage of advances in medications and technology.    -Type 2 diabetes is known to coexist with other important comorbidities such as high blood pressure and high cholesterol.  It is critical to control not only the diabetes but also the high blood pressure and high cholesterol to minimize and delay the risk of complications including coronary artery disease, stroke, amputations, blindness, etc.    - Studies showed that people with diabetes will benefit from a class of medications known as ACE inhibitors and statins.  Unless there are specific reasons not to be on these medications, the standard of care is to consider getting one from these groups of medications at a minimum dose.  These medications are generally considered safe and proven to help protect the heart and the kidneys.    - People with diabetes are encouraged to initiate and maintain regular follow-up with eye doctors, foot doctors, dentists , and if necessary heart and kidney doctors.     - It is highly recommended that people with diabetes quit smoking or stay away from smoking, and get yearly  flu vaccine and pneumonia vaccine at least every 5 years.  One other important lifestyle recommendation is to ensure adequate sleep - at least 7 hours of uninterrupted sleep at night.  -Exercise: If you are able: 30 -60 minutes a day, 4 days a week, or 150 minutes a week.  The longer the better.  Combine stretch, strength, and aerobic activities.  If you were told in the past that you have high risk for cardiovascular diseases, you may seek evaluation by your heart doctor prior to initiating moderate to intense exercise programs.

## 2017-08-16 NOTE — Progress Notes (Signed)
Endocrinology Consult Note       08/16/2017, 5:30 PM   Subjective:    Patient ID: Jonathon Miller, male    DOB: 28-Jan-1925.  Tiron Suski is being seen in consultation for management of currently uncontrolled symptomatic diabetes requested by  Jonathon Macadam, MD.   Past Medical History:  Diagnosis Date  . Alzheimer's dementia   . Anemia of chronic disease 03/18/2017  . Cancer (HCC)    Skin  . Cataract    skin  . CRD (chronic renal disease), stage III ([place]) 03/18/2017  . Diabetes mellitus without complication ([place])   . Gastritis   . Hyperlipidemia   . Hypertension   . Myocardial infarction ([place])   . Thyroid disease    Past Surgical History:  Procedure Laterality Date  . APPENDECTOMY    . EYE SURGERY     Social History   Socioeconomic History  . Marital status: Married    Spouse name: Jonathon Miller  . Number of children: 3  . Years of education: 62  . Highest education level: High school graduate  Occupational History  . 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 Occupation: Child psychotherapist  . 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 Financial resource strain: Not hard at all  . Food insecurity:    Worry: Never true    Inability: Never true  . Transportation needs:    Medical: No    Non-medical: No  Tobacco Use  . Smoking status: Never Smoker  . Smokeless tobacco: Never Used  Substance and Sexual Activity  . Alcohol use: No    Frequency: Never  . Drug use: No  . Sexual activity: Never  Lifestyle  . Physical activity:    Days per week: 0 days    Minutes per session: 0 min  . Stress: To some extent  Relationships  . Social connections:    Talks on phone: Once a week    Gets together: More than three times a week    Attends religious service: Never    Active member of club or organization: No    Attends meetings of clubs or organizations: Never    Relationship status: Married   Other Topics Concern  . Not on file  Social History Narrative   Just moved to [place] with wife Jonathon Miller, wife of 24 years (second)   Lives with Daughter Jonathon Miller who helps care for them   Likes to Golf-not able anymore   Deer Hunting-not able anymore   Prior occupation was baking/cooking      Outpatient Encounter Medications as of 08/16/2017  Medication Sig  . albuterol (PROVENTIL HFA;VENTOLIN HFA) 108 (90 Base) MCG/ACT inhaler Inhale 1 puff into the lungs every 6 (six) hours as needed for wheezing or shortness of breath.  [name] aspirin EC 81 MG tablet Take 81 mg by mouth daily.  . citalopram (CELEXA) 20 MG tablet Take 1 tablet (20 mg total) by mouth daily.  . famotidine (PEPCID) 20 MG tablet Take 1 tablet (20 mg total) by mouth 2 (two) times daily.  [name] glipiZIDE (GLUCOTROL XL) 5 MG 24 hr tablet Take  1 tablet (5 mg total) by mouth daily with breakfast.  . levothyroxine (SYNTHROID) 50 MCG tablet Take 1 tablet (50 mcg total) by mouth daily before breakfast.  . linagliptin (TRADJENTA) 5 MG TABS tablet Take 1 tablet (5 mg total) by mouth daily.  [name] loratadine (CLARITIN) 10 MG tablet Take 1 tablet (10 mg total) by mouth daily.  . nitroGLYCERIN (NITROSTAT) 0.4 MG SL tablet Place 0.4 mg under the tongue every 5 (five) minutes as needed for chest pain.  . pioglitazone (ACTOS) 15 MG tablet Take 15 mg by mouth daily.  . vitamin C (ASCORBIC ACID) 500 MG tablet Take 500 mg by mouth daily.  . [DISCONTINUED] glipiZIDE (GLUCOTROL XL) 2.5 MG 24 hr tablet TAKE 1 TABLET (2.5 MG TOTAL) BY MOUTH DAILY WITH BREAKFAST.   No facility-administered encounter medications on file as of 08/16/2017.     ALLERGIES: Allergies  Allergen Reactions  . Penicillins Other (See Comments)    childhood    VACCINATION STATUS: Immunization History  Administered Date(s) Administered  . Td 04/21/2017    Diabetes  He presents for his initial diabetic visit. He has type 2 diabetes mellitus. Onset time: He was diagnosed  at approximate age of 49 years. There are no hypoglycemic associated symptoms. Pertinent negatives for hypoglycemia include no confusion, headaches, pallor or seizures. Associated symptoms include blurred vision, polydipsia and polyuria. Pertinent negatives for diabetes include no chest pain, no fatigue, no polyphagia and no weakness. There are no hypoglycemic complications. Symptoms are worsening. Risk factors for coronary artery disease include diabetes mellitus, dyslipidemia, hypertension and male sex. Current diabetic treatment includes oral agent (triple therapy). His weight is increasing steadily. He is following a generally unhealthy diet. When asked about meal planning, he reported none. He has not had a previous visit with a dietitian. His home blood glucose trend is increasing steadily. His overall blood glucose range is >200 mg/dl.  Hyperlipidemia  This is a chronic problem. The problem is uncontrolled. Exacerbating diseases include diabetes. Pertinent negatives include no chest pain, myalgias or shortness of breath. He is currently on no antihyperlipidemic treatment. Risk factors for coronary artery disease include diabetes mellitus, dyslipidemia, hypertension, male sex and a sedentary lifestyle.  Hypertension  This is a chronic problem. The current episode started more than 1 year ago. The problem is controlled. Associated symptoms include blurred vision. Pertinent negatives include no chest pain, headaches, neck pain, palpitations or shortness of breath. Risk factors for coronary artery disease include dyslipidemia, diabetes mellitus and male gender. Past treatments include nothing.      Review of Systems  Constitutional: Negative for chills, fatigue, fever and unexpected weight change.  HENT: Negative for dental problem, mouth sores and trouble swallowing.   Eyes: Positive for blurred vision. Negative for visual disturbance.  Respiratory: Negative for cough, choking, chest tightness,  shortness of breath and wheezing.   Cardiovascular: Negative for chest pain, palpitations and leg swelling.  Gastrointestinal: Negative for abdominal distention, abdominal pain, constipation, diarrhea, nausea and vomiting.  Endocrine: Positive for polydipsia and polyuria. Negative for polyphagia.  Genitourinary: Negative for dysuria, flank pain, hematuria and urgency.  Musculoskeletal: Negative for back pain, gait problem, myalgias and neck pain.  Skin: Negative for pallor, rash and wound.  Neurological: Negative for seizures, syncope, weakness, numbness and headaches.  Psychiatric/Behavioral: Negative.  Negative for confusion and dysphoric mood.    Objective:    BP 133/72   Pulse 69   Ht 5\' 3"  (1.6 m)   Wt 143 lb (64.9 kg)  BMI 25.33 kg/m   Wt Readings from Last 3 Encounters:  08/16/17 143 lb (64.9 kg)  08/10/17 143 lb (64.9 kg)  06/11/17 137 lb 1.9 oz (62.2 kg)     Physical Exam  Constitutional: He is oriented to person, place, and time. He appears well-developed. He is cooperative. No distress.  HENT:  Head: Normocephalic and atraumatic.  Eyes: EOM are normal.  Neck: Normal range of motion. Neck supple. No tracheal deviation present. No thyromegaly present.  Cardiovascular: Normal rate, S1 normal, S2 normal and normal heart sounds. Exam reveals no gallop.  No murmur heard. Pulses:      Dorsalis pedis pulses are 1+ on the right side, and 1+ on the left side.       Posterior tibial pulses are 1+ on the right side, and 1+ on the left side.  Pulmonary/Chest: Breath sounds normal. No respiratory distress. He has no wheezes.  Abdominal: Soft. Bowel sounds are normal. He exhibits no distension. There is no tenderness. There is no guarding and no CVA tenderness.  Musculoskeletal: He exhibits no edema.       Right shoulder: He exhibits no swelling and no deformity.  Neurological: He is alert and oriented to person, place, and time. He has normal strength and normal reflexes. No  cranial nerve deficit or sensory deficit. Gait normal.  Skin: Skin is warm and dry. No rash noted. No cyanosis. Nails show no clubbing.  Psychiatric: He has a normal mood and affect. His speech is normal. Judgment normal. Cognition and memory are normal.   CMP ( most recent) CMP     Component Value Date/Time   NA 134 (L) 06/14/2017 1210   K 4.4 06/14/2017 1210   CL 97 (L) 06/14/2017 1210   CO2 29 06/14/2017 1210   GLUCOSE 219 (H) 06/14/2017 1210   BUN 20 06/14/2017 1210   CREATININE 1.37 (H) 06/14/2017 1210   CALCIUM 9.5 06/14/2017 1210   PROT 7.3 05/28/2017 1306   ALBUMIN 3.0 (L) 05/28/2017 1306   AST 14 (L) 05/28/2017 1306   ALT 13 (L) 05/28/2017 1306   ALKPHOS 67 05/28/2017 1306   BILITOT 0.7 05/28/2017 1306   GFRNONAA 45 (L) 05/28/2017 1306   GFRNONAA 38 (L) 03/17/2017 1614   GFRAA 52 (L) 05/28/2017 1306   GFRAA 44 (L) 03/17/2017 1614    Diabetic Labs (most recent): Lab Results  Component Value Date   HGBA1C 10.1 (H) 06/14/2017   HGBA1C 9.5 (H) 03/17/2017     Lipid Panel ( most recent) Lipid Panel     Component Value Date/Time   CHOL 179 03/17/2017 1614   TRIG 185 (H) 03/17/2017 1614   HDL 41 03/17/2017 1614   CHOLHDL 4.4 03/17/2017 1614   LDLCALC 107 (H) 03/17/2017 1614      Lab Results  Component Value Date   TSH 1.76 03/17/2017      Assessment & Plan:   1. Type 2 diabetes mellitus with stage 3 chronic kidney disease, without long-term current use of insulin (HCC)   - Naszir Cott has currently uncontrolled symptomatic type 2 DM since 84 years of age,  with most recent A1c of 10.1 %.   recent labs reviewed.  -his diabetes is complicated by coronary artery disease and Moise Friday remains at a high risk for more acute and chronic complications which include CAD, CVA, CKD, retinopathy, and neuropathy. These are all discussed in detail with the patient.  - I have counseled him on diet management  by adopting a  carbohydrate restricted/protein  rich diet.  - Suggestion is made for him to avoid simple carbohydrates  from his diet including Cakes, Sweet Desserts, Ice Cream, Soda (diet and regular), Sweet Tea, Candies, Chips, Cookies, Store Bought Juices, Alcohol in Excess of  1-2 drinks a day, Artificial Sweeteners, and "Sugar-free" Products. This will help patient to have stable blood glucose profile and potentially avoid unintended weight gain.  - I encouraged him to switch to  unprocessed or minimally processed complex starch and increased protein intake (animal or plant source), fruits, and vegetables.  - he is advised to stick to a routine mealtimes to eat 3 meals  a day and avoid unnecessary snacks ( to snack only to correct hypoglycemia).   - he will be scheduled with Jearld Fenton, RDN, CDE for individualized diabetes education.  - I have approached him with the following individualized plan to manage diabetes and patient agrees:   -He is most recent to A1c measurements are 9.5% and 10.1%.  This indicates that he may need insulin treatment in order for him to achieve control of diabetes to target.   -However, in this elderly man with limited social support, he is too high risk to give insulin to.  Him and his elderly wife wish for him to remain on oral medications as long as they can.   -I advised him to continue pioglitazone 50 mg p.o. daily, increase glipizide to 5 mg p.o. daily with breakfast, and continue Tradjenta 5 mg p.o. daily with breakfast.    He was asked to start monitoring blood glucose 2 times a day-before breakfast and at bedtime. -Patient is encouraged to call clinic for blood glucose levels less than 70 or above 200 mg /dl. -He will be approached for insulin treatment if his A1c remains above 9% on next visit.  His grown daughter lives close by and offering to help. - Patient specific target  A1c;  LDL, HDL, Triglycerides, and  Waist Circumference were discussed in detail.  2) BP/HTN: His blood pressure is  controlled to target.  He is not on any blood pressure medications at this time.    3) Lipids/HPL:   His recent lipid panel showed LDL of 101.  He is not on statins.  He will be continued off his statins for now.  4)  Weight/Diet: CDE Consult will be initiated , exercise, and detailed carbohydrates information provided.  5) both thyroidism: -His recent TSH is on target.  He is advised to continue Synthroid 50 mcg p.o. daily before breakfast.  - We discussed about correct intake of levothyroxine, at fasting, with water, separated by at least 30 minutes from breakfast, and separated by more than 4 hours from calcium, iron, multivitamins, acid reflux medications (PPIs). -Patient is made aware of the fact that thyroid hormone replacement is needed for life, dose to be adjusted by periodic monitoring of thyroid function tests.  6) Chronic Care/Health Maintenance:  -he  is encouraged to initiate and continue to follow up with Ophthalmology, Dentist,  Podiatrist at least yearly or according to recommendations, and advised to  stay away from smoking. I have recommended yearly flu vaccine and pneumonia vaccine at least every 5 years; moderate intensity exercise for up to 150 minutes weekly; and  sleep for at least 7 hours a day.  - I advised patient to maintain close follow up with Jonathon Macadam, MD for primary care needs.  - Time spent with the patient: 45 minutes, of which >50% was spent in obtaining information  about his symptoms, reviewing his previous labs, evaluations, and treatments, counseling him about his currently uncontrolled type 2 diabetes with A1c of 10.1%, hypothyroidism, hyperlipidemia, hypertension, and developing developing  plans for long term treatment based on the latest recommendations.  Sharlene Motts participated in the discussions, expressed understanding, and voiced agreement with the above plans.  All questions were answered to his satisfaction. he is encouraged to contact  clinic should he have any questions or concerns prior to his return visit.  Follow up plan: - Return in about 7 weeks (around 10/04/2017) for meter, and logs.  Glade Lloyd, MD [facility] [address] [place], [place] [address] Phone: [phone]  Fax: [phone]    08/16/2017, 5:30 PM  This note was partially dictated with voice recognition software. Similar sounding words can be transcribed inadequately or may not  be corrected upon review.

## 2017-08-20 NOTE — Telephone Encounter (Signed)
Spoke with Nunzio Cory (daughter) and she stated that the patient had completed his physical therapy and that PT said he was finished and didn't need additional therapy.

## 2017-09-02 ENCOUNTER — Other Ambulatory Visit: Payer: Self-pay | Admitting: Family Medicine

## 2017-09-02 DIAGNOSIS — E1165 Type 2 diabetes mellitus with hyperglycemia: Secondary | ICD-10-CM

## 2017-09-02 DIAGNOSIS — IMO0002 Reserved for concepts with insufficient information to code with codable children: Secondary | ICD-10-CM

## 2017-09-02 DIAGNOSIS — E118 Type 2 diabetes mellitus with unspecified complications: Principal | ICD-10-CM

## 2017-10-04 ENCOUNTER — Ambulatory Visit (INDEPENDENT_AMBULATORY_CARE_PROVIDER_SITE_OTHER): Payer: Medicare Other | Admitting: "Endocrinology

## 2017-10-04 ENCOUNTER — Encounter: Payer: Self-pay | Admitting: "Endocrinology

## 2017-10-04 VITALS — BP 142/80 | HR 70 | Ht 63.0 in | Wt 148.0 lb

## 2017-10-04 DIAGNOSIS — N183 Chronic kidney disease, stage 3 unspecified: Secondary | ICD-10-CM

## 2017-10-04 DIAGNOSIS — E039 Hypothyroidism, unspecified: Secondary | ICD-10-CM

## 2017-10-04 DIAGNOSIS — E1122 Type 2 diabetes mellitus with diabetic chronic kidney disease: Secondary | ICD-10-CM | POA: Diagnosis not present

## 2017-10-04 NOTE — Progress Notes (Signed)
Endocrinology follow-up note       10/04/2017, 2:20 PM   Subjective:    Patient ID: Jonathon Miller, male    DOB: Nov 24, 1925.  Jonathon Miller is being seen in follow-up for management of currently uncontrolled symptomatic diabetes requested by  [name], MD.   Past Medical History:  Diagnosis Date  . Alzheimer's dementia   . Anemia of chronic disease 03/18/2017  . Cancer (HCC)    Skin  . Cataract    skin  . CRD (chronic renal disease), stage III ([place]) 03/18/2017  . Diabetes mellitus without complication ([place])   . Gastritis   . Hyperlipidemia   . Hypertension   . Myocardial infarction ([place])   . Thyroid disease    Past Surgical History:  Procedure Laterality Date  . APPENDECTOMY    . EYE SURGERY     Social History   Socioeconomic History  . Marital status: Married    Spouse name: Jonathon Miller  . Number of children: 3  . Years of education: 39  . Highest education level: High school graduate  Occupational History  . 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 Occupation: Child psychotherapist  . 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 Financial resource strain: Not hard at all  . Food insecurity:    Worry: Never true    Inability: Never true  . Transportation needs:    Medical: No    Non-medical: No  Tobacco Use  . Smoking status: Never Smoker  . Smokeless tobacco: Never Used  Substance and Sexual Activity  . Alcohol use: No    Frequency: Never  . Drug use: No  . Sexual activity: Never  Lifestyle  . Physical activity:    Days per week: 0 days    Minutes per session: 0 min  . Stress: To some extent  Relationships  . Social connections:    Talks on phone: Once a week    Gets together: More than three times a week    Attends religious service: Never    Active member of club or organization: No    Attends meetings of clubs or organizations: Never    Relationship status: Married   Other Topics Concern  . Not on file  Social History Narrative   Just moved to [place] with wife Jonathon Miller, wife of 24 years (second)   Lives with Daughter Jonathon Miller who helps care for them   Likes to Golf-not able anymore   Deer Hunting-not able anymore   Prior occupation was baking/cooking      Outpatient Encounter Medications as of 10/04/2017  Medication Sig  . albuterol (PROVENTIL HFA;VENTOLIN HFA) 108 (90 Base) MCG/ACT inhaler Inhale 1 puff into the lungs every 6 (six) hours as needed for wheezing or shortness of breath.  [name] aspirin EC 81 MG tablet Take 81 mg by mouth daily.  . citalopram (CELEXA) 20 MG tablet Take 1 tablet (20 mg total) by mouth daily.  . famotidine (PEPCID) 20 MG tablet Take 1 tablet (20 mg total) by mouth 2 (two) times daily.  [name] glipiZIDE (GLUCOTROL XL) 5 MG 24 hr tablet Take  1 tablet (5 mg total) by mouth daily with breakfast.  . levothyroxine (SYNTHROID) 50 MCG tablet Take 1 tablet (50 mcg total) by mouth daily before breakfast.  . loratadine (CLARITIN) 10 MG tablet Take 1 tablet (10 mg total) by mouth daily.  . nitroGLYCERIN (NITROSTAT) 0.4 MG SL tablet Place 0.4 mg under the tongue every 5 (five) minutes as needed for chest pain.  . pioglitazone (ACTOS) 15 MG tablet Take 15 mg by mouth daily.  . TRADJENTA 5 MG TABS tablet TAKE 1 TABLET BY MOUTH EVERY DAY  . vitamin C (ASCORBIC ACID) 500 MG tablet Take 500 mg by mouth daily.   No facility-administered encounter medications on file as of 10/04/2017.     ALLERGIES: Allergies  Allergen Reactions  . Penicillins Other (See Comments)    childhood    VACCINATION STATUS: Immunization History  Administered Date(s) Administered  . Td 04/21/2017    Diabetes  He presents for his follow-up diabetic visit. He has type 2 diabetes mellitus. Onset time: He was diagnosed at approximate age of 54 years. His disease course has been improving. There are no hypoglycemic associated symptoms. Pertinent negatives for  hypoglycemia include no confusion, headaches, pallor or seizures. Associated symptoms include blurred vision, polydipsia and polyuria. Pertinent negatives for diabetes include no chest pain, no fatigue, no polyphagia and no weakness. There are no hypoglycemic complications. Symptoms are improving. Risk factors for coronary artery disease include diabetes mellitus, dyslipidemia, hypertension and male sex. Current diabetic treatment includes oral agent (triple therapy). His weight is increasing steadily. He is following a generally unhealthy diet. When asked about meal planning, he reported none. He has not had a previous visit with a dietitian. His home blood glucose trend is increasing steadily. His breakfast blood glucose range is generally 140-180 mg/dl. His overall blood glucose range is 180-200 mg/dl.  Hyperlipidemia  This is a chronic problem. The problem is uncontrolled. Exacerbating diseases include diabetes. Pertinent negatives include no chest pain, myalgias or shortness of breath. He is currently on no antihyperlipidemic treatment. Risk factors for coronary artery disease include diabetes mellitus, dyslipidemia, hypertension, male sex and a sedentary lifestyle.  Hypertension  This is a chronic problem. The current episode started more than 1 year ago. The problem is controlled. Associated symptoms include blurred vision. Pertinent negatives include no chest pain, headaches, neck pain, palpitations or shortness of breath. Risk factors for coronary artery disease include dyslipidemia, diabetes mellitus and male gender. Past treatments include nothing.      Review of Systems  Constitutional: Negative for chills, fatigue, fever and unexpected weight change.  HENT: Negative for dental problem, mouth sores and trouble swallowing.   Eyes: Positive for blurred vision. Negative for visual disturbance.  Respiratory: Negative for cough, choking, chest tightness, shortness of breath and wheezing.    Cardiovascular: Negative for chest pain, palpitations and leg swelling.  Gastrointestinal: Negative for abdominal distention, abdominal pain, constipation, diarrhea, nausea and vomiting.  Endocrine: Positive for polydipsia and polyuria. Negative for polyphagia.  Genitourinary: Negative for dysuria, flank pain, hematuria and urgency.  Musculoskeletal: Negative for back pain, gait problem, myalgias and neck pain.  Skin: Negative for pallor, rash and wound.  Neurological: Negative for seizures, syncope, weakness, numbness and headaches.  Psychiatric/Behavioral: Negative for confusion and dysphoric mood.    Objective:    BP (!) 142/80   Pulse 70   Ht 5\' 3"  (1.6 m)   Wt 148 lb (67.1 kg)   BMI 26.22 kg/m   Wt Readings from Last 3  Encounters:  10/04/17 148 lb (67.1 kg)  08/16/17 143 lb (64.9 kg)  08/10/17 143 lb (64.9 kg)     Physical Exam  Constitutional: He is oriented to person, place, and time. He appears well-developed. He is cooperative. No distress.  HENT:  Head: Normocephalic and atraumatic.  Eyes: EOM are normal.  Neck: Normal range of motion. Neck supple. No tracheal deviation present. No thyromegaly present.  Cardiovascular: Normal rate, S1 normal, S2 normal and normal heart sounds. Exam reveals no gallop.  No murmur heard. Pulses:      Dorsalis pedis pulses are 1+ on the right side, and 1+ on the left side.       Posterior tibial pulses are 1+ on the right side, and 1+ on the left side.  Pulmonary/Chest: Breath sounds normal. No respiratory distress. He has no wheezes.  Abdominal: Soft. Bowel sounds are normal. He exhibits no distension. There is no tenderness. There is no guarding and no CVA tenderness.  Musculoskeletal: He exhibits no edema.       Right shoulder: He exhibits no swelling and no deformity.  Neurological: He is alert and oriented to person, place, and time. He has normal strength and normal reflexes. No cranial nerve deficit or sensory deficit. Gait  normal.  Skin: Skin is warm and dry. No rash noted. No cyanosis. Nails show no clubbing.  Psychiatric: He has a normal mood and affect. His speech is normal. Judgment normal. Cognition and memory are normal.   CMP ( most recent) CMP     Component Value Date/Time   NA 134 (L) 06/14/2017 1210   K 4.4 06/14/2017 1210   CL 97 (L) 06/14/2017 1210   CO2 29 06/14/2017 1210   GLUCOSE 219 (H) 06/14/2017 1210   BUN 20 06/14/2017 1210   CREATININE 1.37 (H) 06/14/2017 1210   CALCIUM 9.5 06/14/2017 1210   PROT 7.3 05/28/2017 1306   ALBUMIN 3.0 (L) 05/28/2017 1306   AST 14 (L) 05/28/2017 1306   ALT 13 (L) 05/28/2017 1306   ALKPHOS 67 05/28/2017 1306   BILITOT 0.7 05/28/2017 1306   GFRNONAA 45 (L) 05/28/2017 1306   GFRNONAA 38 (L) 03/17/2017 1614   GFRAA 52 (L) 05/28/2017 1306   GFRAA 44 (L) 03/17/2017 1614    Diabetic Labs (most recent): Lab Results  Component Value Date   HGBA1C 10.1 (H) 06/14/2017   HGBA1C 9.5 (H) 03/17/2017     Lipid Panel ( most recent) Lipid Panel     Component Value Date/Time   CHOL 179 03/17/2017 1614   TRIG 185 (H) 03/17/2017 1614   HDL 41 03/17/2017 1614   CHOLHDL 4.4 03/17/2017 1614   LDLCALC 107 (H) 03/17/2017 1614      Lab Results  Component Value Date   TSH 1.76 03/17/2017      Assessment & Plan:   1. Type 2 diabetes mellitus with stage 3 chronic kidney disease, without long-term current use of insulin (HCC)   - Viggo Perko has currently uncontrolled symptomatic type 2 DM since 84 years of age. -He returns with improved glycemic profile, on target fasting, safe overnight blood glucose readings.   His most recent A1c of 10.1 %.   recent labs reviewed.  -his diabetes is complicated by coronary artery disease and Caedan Sumler remains at a high risk for more acute and chronic complications which include CAD, CVA, CKD, retinopathy, and neuropathy. These are all discussed in detail with the patient.  - I have counseled him on diet  management  by adopting a carbohydrate restricted/protein rich diet.  -  Suggestion is made for him to avoid simple carbohydrates  from his diet including Cakes, Sweet Desserts / Pastries, Ice Cream, Soda (diet and regular), Sweet Tea, Candies, Chips, Cookies, Store Bought Juices, Alcohol in Excess of  1-2 drinks a day, Artificial Sweeteners, and "Sugar-free" Products. This will help patient to have stable blood glucose profile and potentially avoid unintended weight gain.  - I encouraged him to switch to  unprocessed or minimally processed complex starch and increased protein intake (animal or plant source), fruits, and vegetables.  - he is advised to stick to a routine mealtimes to eat 3 meals  a day and avoid unnecessary snacks ( to snack only to correct hypoglycemia).   - he will be scheduled with Jearld Fenton, RDN, CDE for individualized diabetes education.  - I have approached him with the following individualized plan to manage diabetes and patient agrees:   -Despite his recent high A1c, he returns with near target fasting blood glucose profile and safe overnight blood glucose readings.    -In this elderly man with limited social support, he is too high risk to give insulin to.  Him and his elderly wife wish for him to remain on oral medications as long as they can.   -I advised him to continue pioglitazone 15 mg p.o. daily, continue glipizide  5 mg p.o. daily with breakfast, and continue Tradjenta 5 mg p.o. daily with breakfast.    He was asked to start monitoring blood glucose 2 times a day-before breakfast and at bedtime. -Patient is encouraged to call clinic for blood glucose levels less than 70 or above 200 mg /dl. -He will be approached for insulin treatment if his A1c remains above 9% on next visit.  His grown daughter lives close by and offering to help. - Patient specific target  A1c;  LDL, HDL, Triglycerides, and  Waist Circumference were discussed in detail.  2) BP/HTN: His  blood pressure is not controlled to target, was on target last visit.  He is not on any blood pressure medications at this time.  He will be observed without medications. 3) Lipids/HPL:   His recent lipid panel showed LDL of 101.  He is not on statins.  He will be continued off his statins for now.  4)  Weight/Diet: CDE Consult will be initiated , exercise, and detailed carbohydrates information provided.  5) hypothyroidism:  -His recent TSH is on target.  He is advised to continue Synthroid 50 mcg p.o. daily before breakfast.  - We discussed about correct intake of levothyroxine, at fasting, with water, separated by at least 30 minutes from breakfast, and separated by more than 4 hours from calcium, iron, multivitamins, acid reflux medications (PPIs). -Patient is made aware of the fact that thyroid hormone replacement is needed for life, dose to be adjusted by periodic monitoring of thyroid function tests.  6) Chronic Care/Health Maintenance:  -he  is encouraged to initiate and continue to follow up with Ophthalmology, Dentist,  Podiatrist at least yearly or according to recommendations, and advised to  stay away from smoking. I have recommended yearly flu vaccine and pneumonia vaccine at least every 5 years; moderate intensity exercise for up to 150 minutes weekly; and  sleep for at least 7 hours a day.  - I advised patient to maintain close follow up with [name], MD for primary care needs.  - Time spent with the patient: 25 min, of which >50% was  spent in reviewing his blood glucose logs , discussing his hypo- and hyper-glycemic episodes, reviewing his current and  previous labs and insulin doses and developing a plan to avoid hypo- and hyper-glycemia. Please refer to Patient Instructions for Blood Glucose Monitoring and Insulin/Medications Dosing Guide"  in media tab for additional information. Sharlene Motts participated in the discussions, expressed understanding, and voiced  agreement with the above plans.  All questions were answered to his satisfaction. he is encouraged to contact clinic should he have any questions or concerns prior to his return visit.   Follow up plan: - Return in about 9 weeks (around 12/06/2017) for Follow up with Pre-visit Labs, Meter, and Logs.  Glade Lloyd, MD [facility] [address] [place], [place] [address] Phone: [phone]  Fax: [phone]    10/04/2017, 2:20 PM  This note was partially dictated with voice recognition software. Similar sounding words can be transcribed inadequately or may not  be corrected upon review.

## 2017-10-13 ENCOUNTER — Encounter: Payer: Self-pay | Admitting: "Endocrinology

## 2017-10-14 LAB — HEMOGLOBIN A1C: Hgb A1c MFr Bld: 9.2 — AB (ref 4.0–6.0)

## 2017-10-14 LAB — TSH: TSH: 3.21 (ref 0.41–5.90)

## 2017-10-14 LAB — BASIC METABOLIC PANEL
BUN: 28 — AB (ref 4–21)
Creatinine: 1.5 — AB (ref 0.6–1.3)

## 2017-10-14 LAB — VITAMIN D 25 HYDROXY (VIT D DEFICIENCY, FRACTURES): VIT D 25 HYDROXY: 25

## 2017-10-14 LAB — LIPID PANEL
Cholesterol: 291 — AB (ref 0–200)
HDL: 37 (ref 35–70)
Triglycerides: 490 — AB (ref 40–160)

## 2017-11-07 ENCOUNTER — Other Ambulatory Visit: Payer: Self-pay | Admitting: "Endocrinology

## 2017-11-18 ENCOUNTER — Telehealth: Payer: Self-pay | Admitting: "Endocrinology

## 2017-11-18 NOTE — Telephone Encounter (Signed)
Jonathon Miller is asking for a refill on glipiZIDE (GLUCOTROL XL) 5 MG 24 hr tablet  Sent to [facility]

## 2017-11-19 MED ORDER — GLIPIZIDE ER 5 MG PO TB24: ORAL_TABLET | ORAL | 0 refills | Status: DC

## 2017-12-13 ENCOUNTER — Encounter: Payer: Self-pay | Admitting: "Endocrinology

## 2017-12-13 ENCOUNTER — Ambulatory Visit (INDEPENDENT_AMBULATORY_CARE_PROVIDER_SITE_OTHER): Payer: Medicare Other | Admitting: "Endocrinology

## 2017-12-13 VITALS — BP 143/68 | HR 61 | Ht 63.0 in | Wt 150.0 lb

## 2017-12-13 DIAGNOSIS — N183 Chronic kidney disease, stage 3 (moderate): Secondary | ICD-10-CM | POA: Diagnosis not present

## 2017-12-13 DIAGNOSIS — E039 Hypothyroidism, unspecified: Secondary | ICD-10-CM | POA: Diagnosis not present

## 2017-12-13 DIAGNOSIS — E1122 Type 2 diabetes mellitus with diabetic chronic kidney disease: Secondary | ICD-10-CM

## 2017-12-13 MED ORDER — GLIPIZIDE ER 5 MG PO TB24: ORAL_TABLET | ORAL | 2 refills | Status: DC

## 2017-12-13 NOTE — Patient Instructions (Signed)

## 2017-12-13 NOTE — Progress Notes (Signed)
Endocrinology follow-up note       12/13/2017, 5:59 PM   Subjective:    Patient ID: Jonathon Miller, male    DOB: [birth year].  [name] is being seen in follow-up for management of currently uncontrolled symptomatic type 2 diabetes, hypothyroidism, hypertension, hyperlipidemia. PMD:   Doree Albee, MD.   Past Medical History:  Diagnosis Date  . Alzheimer's dementia ([place])   . Anemia of chronic disease 03/18/2017  . Cancer (HCC)    Skin  . Cataract    skin  . CRD (chronic renal disease), stage III ([place]) 03/18/2017  . Diabetes mellitus without complication ([place])   . Gastritis   . Hyperlipidemia   . Hypertension   . Myocardial infarction ([place])   . Thyroid disease    Past Surgical History:  Procedure Laterality Date  . APPENDECTOMY    . EYE SURGERY     Social History   Socioeconomic History  . Marital status: Married    Spouse name: Jonathon Miller  . Number of children: 3  . Years of education: 69  . Highest education level: High school graduate  Occupational History  . 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 Occupation: Child psychotherapist  . 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 Financial resource strain: Not hard at all  . Food insecurity:    Worry: Never true    Inability: Never true  . Transportation needs:    Medical: No    Non-medical: No  Tobacco Use  . Smoking status: Never Smoker  . Smokeless tobacco: Never Used  Substance and Sexual Activity  . Alcohol use: No    Frequency: Never  . Drug use: No  . Sexual activity: Never  Lifestyle  . Physical activity:    Days per week: 0 days    Minutes per session: 0 min  . Stress: To some extent  Relationships  . Social connections:    Talks on phone: Once a week    Gets together: More than three times a week    Attends religious service: Never    Active member of club or organization: No    Attends meetings of clubs  or organizations: Never    Relationship status: Married  Other Topics Concern  . Not on file  Social History Narrative   Just moved to [place] with wife Jonathon Miller, wife of 24 years (second)   Lives with Daughter Jonathon Miller who helps care for them   Likes to Golf-not able anymore   Deer Hunting-not able anymore   Prior occupation was baking/cooking      Outpatient Encounter Medications as of 12/13/2017  Medication Sig  . albuterol (PROVENTIL HFA;VENTOLIN HFA) 108 (90 Base) MCG/ACT inhaler Inhale 1 puff into the lungs every 6 (six) hours as needed for wheezing or shortness of breath.  [name] aspirin EC 81 MG tablet Take 81 mg by mouth daily.  . citalopram (CELEXA) 20 MG tablet Take 1 tablet (20 mg total) by mouth daily.  . famotidine (PEPCID) 20 MG tablet Take 1 tablet (20 mg total) by mouth 2 (two) times daily.  [name] glipiZIDE (GLUCOTROL  XL) 5 MG 24 hr tablet TAKE 2 TABLET BY MOUTH WITH BREAKFAST AND 1 TABLET AT SUPPER  . levothyroxine (SYNTHROID) 50 MCG tablet Take 1 tablet (50 mcg total) by mouth daily before breakfast.  . loratadine (CLARITIN) 10 MG tablet Take 1 tablet (10 mg total) by mouth daily.  . nitroGLYCERIN (NITROSTAT) 0.4 MG SL tablet Place 0.4 mg under the tongue every 5 (five) minutes as needed for chest pain.  . pioglitazone (ACTOS) 15 MG tablet Take 15 mg by mouth daily.  . TRADJENTA 5 MG TABS tablet TAKE 1 TABLET BY MOUTH EVERY DAY  . vitamin C (ASCORBIC ACID) 500 MG tablet Take 500 mg by mouth daily.  . [DISCONTINUED] glipiZIDE (GLUCOTROL XL) 5 MG 24 hr tablet TAKE 1 TABLET BY MOUTH EVERY DAY WITH BREAKFAST   No facility-administered encounter medications on file as of 12/13/2017.     ALLERGIES: Allergies  Allergen Reactions  . Penicillins Other (See Comments)    childhood    VACCINATION STATUS: Immunization History  Administered Date(s) Administered  . Td 04/21/2017    Diabetes  He presents for his follow-up diabetic visit. He has type 2 diabetes mellitus.  Onset time: He was diagnosed at approximate age of 38 years. His disease course has been improving. There are no hypoglycemic associated symptoms. Pertinent negatives for hypoglycemia include no confusion, headaches, pallor or seizures. Associated symptoms include blurred vision, polydipsia and polyuria. Pertinent negatives for diabetes include no chest pain, no fatigue, no polyphagia and no weakness. There are no hypoglycemic complications. Symptoms are improving. Risk factors for coronary artery disease include diabetes mellitus, dyslipidemia, hypertension and male sex. Current diabetic treatment includes oral agent (triple therapy). His weight is stable. He is following a generally unhealthy diet. When asked about meal planning, he reported none. He has not had a previous visit with a dietitian. His home blood glucose trend is increasing steadily. His breakfast blood glucose range is generally 130-140 mg/dl. His bedtime blood glucose range is generally 180-200 mg/dl. His overall blood glucose range is 180-200 mg/dl.  Hyperlipidemia  This is a chronic problem. The problem is uncontrolled. Exacerbating diseases include diabetes. Pertinent negatives include no chest pain, myalgias or shortness of breath. He is currently on no antihyperlipidemic treatment. Risk factors for coronary artery disease include diabetes mellitus, dyslipidemia, hypertension, male sex and a sedentary lifestyle.  Hypertension  This is a chronic problem. The current episode started more than 1 year ago. The problem is controlled. Associated symptoms include blurred vision. Pertinent negatives include no chest pain, headaches, neck pain, palpitations or shortness of breath. Risk factors for coronary artery disease include dyslipidemia, diabetes mellitus and male gender. Past treatments include nothing.    Review of Systems  Constitutional: Negative for chills, fatigue, fever and unexpected weight change.  HENT: Negative for dental  problem, mouth sores and trouble swallowing.   Eyes: Positive for blurred vision. Negative for visual disturbance.  Respiratory: Negative for cough, choking, chest tightness, shortness of breath and wheezing.   Cardiovascular: Negative for chest pain, palpitations and leg swelling.  Gastrointestinal: Negative for abdominal distention, abdominal pain, constipation, diarrhea, nausea and vomiting.  Endocrine: Positive for polydipsia and polyuria. Negative for polyphagia.  Genitourinary: Negative for dysuria, flank pain, hematuria and urgency.  Musculoskeletal: Negative for back pain, gait problem, myalgias and neck pain.  Skin: Negative for pallor, rash and wound.  Neurological: Negative for seizures, syncope, weakness, numbness and headaches.  Psychiatric/Behavioral: Negative for confusion and dysphoric mood.    Objective:  BP (!) 143/68   Pulse 61   Ht 5\' 3"  (1.6 m)   Wt 150 lb (68 kg)   BMI 26.57 kg/m   Wt Readings from Last 3 Encounters:  12/13/17 150 lb (68 kg)  10/04/17 148 lb (67.1 kg)  08/16/17 143 lb (64.9 kg)     Physical Exam  Constitutional: He is oriented to person, place, and time. He appears well-developed. He is cooperative. No distress.  HENT:  Head: Normocephalic and atraumatic.  Eyes: EOM are normal.  Neck: Normal range of motion. Neck supple. No tracheal deviation present. No thyromegaly present.  Cardiovascular: Normal rate, S1 normal, S2 normal and normal heart sounds. Exam reveals no gallop.  No murmur heard. Pulses:      Dorsalis pedis pulses are 1+ on the right side, and 1+ on the left side.       Posterior tibial pulses are 1+ on the right side, and 1+ on the left side.  Pulmonary/Chest: Breath sounds normal. No respiratory distress. He has no wheezes.  Abdominal: Soft. Bowel sounds are normal. He exhibits no distension. There is no tenderness. There is no guarding and no CVA tenderness.  Musculoskeletal: He exhibits edema.       Right shoulder: He  exhibits no swelling and no deformity.  Neurological: He is alert and oriented to person, place, and time. He has normal strength and normal reflexes. No cranial nerve deficit or sensory deficit. Gait normal.  Skin: Skin is warm and dry. No rash noted. No cyanosis. Nails show no clubbing.  Psychiatric: He has a normal mood and affect. His speech is normal. Judgment normal. Cognition and memory are normal.   CMP ( most recent) CMP     Component Value Date/Time   NA 134 (L) 06/14/2017 1210   K 4.4 06/14/2017 1210   CL 97 (L) 06/14/2017 1210   CO2 29 06/14/2017 1210   GLUCOSE 219 (H) 06/14/2017 1210   BUN 28 (A) 10/14/2017   CREATININE 1.5 (A) 10/14/2017   CREATININE 1.37 (H) 06/14/2017 1210   CALCIUM 9.5 06/14/2017 1210   PROT 7.3 05/28/2017 1306   ALBUMIN 3.0 (L) 05/28/2017 1306   AST 14 (L) 05/28/2017 1306   ALT 13 (L) 05/28/2017 1306   ALKPHOS 67 05/28/2017 1306   BILITOT 0.7 05/28/2017 1306   GFRNONAA 45 (L) 05/28/2017 1306   GFRNONAA 38 (L) 03/17/2017 1614   GFRAA 52 (L) 05/28/2017 1306   GFRAA 44 (L) 03/17/2017 1614    Diabetic Labs (most recent): Lab Results  Component Value Date   HGBA1C 9.2 (A) 10/14/2017   HGBA1C 10.1 (H) 06/14/2017   HGBA1C 9.5 (H) 03/17/2017     Lipid Panel ( most recent) Lipid Panel     Component Value Date/Time   CHOL 291 (A) 10/14/2017   TRIG 490 (A) 10/14/2017   HDL 37 10/14/2017   CHOLHDL 4.4 03/17/2017 1614   LDLCALC 107 (H) 03/17/2017 1614      Lab Results  Component Value Date   TSH 3.21 10/14/2017   TSH 1.76 03/17/2017      Assessment & Plan:   1. Type 2 diabetes mellitus with stage 3 chronic kidney disease, without long-term current use of insulin (HCC)   - Ameet Sandy has currently uncontrolled symptomatic type 2 DM since 84 years of age. -He returns with controlled fasting glycemia, still above target postprandial glycemic profile.  His previsit labs show A1c of 9.2% improving from 10.1%.  -His recent labs  reviewed.  -his  diabetes is complicated by coronary artery disease and Mccade Sullenberger remains at a high risk for more acute and chronic complications which include CAD, CVA, CKD, retinopathy, and neuropathy. These are all discussed in detail with the patient.  - I have counseled him on diet management  by adopting a carbohydrate restricted/protein rich diet.  -  Suggestion is made for him to avoid simple carbohydrates  from his diet including Cakes, Sweet Desserts / Pastries, Ice Cream, Soda (diet and regular), Sweet Tea, Candies, Chips, Cookies, Store Bought Juices, Alcohol in Excess of  1-2 drinks a day, Artificial Sweeteners, and "Sugar-free" Products. This will help patient to have stable blood glucose profile and potentially avoid unintended weight gain.   - I encouraged him to switch to  unprocessed or minimally processed complex starch and increased protein intake (animal or plant source), fruits, and vegetables.  - he is advised to stick to a routine mealtimes to eat 3 meals  a day and avoid unnecessary snacks ( to snack only to correct hypoglycemia).   - he will be scheduled with Jearld Fenton, RDN, CDE for individualized diabetes education.  - I have approached him with the following individualized plan to manage diabetes and patient agrees:   -Based on his presentation with improved glycemic profile and A1c of 9.2%, coupled with the fact that he is elderly with limited social support making him too high risk to give insulin to.    -He is advised to increase glipizide to 10 mg p.o. daily with breakfast, 5 mg with supper, and continue with pioglitazone 15 mg p.o. and Tradjenta 5 mg p.o. daily with breakfast.   -His wife is offering to help him achieve at least 1 glucose reading before breakfast and at any other time as needed. -Patient is encouraged to call clinic for blood glucose levels less than 70 or above 200 mg /dl. -He will be approached for insulin treatment if his A1c  remains above 9% on next visit.  His grown daughter lives close by and offering to help. - Patient specific target  A1c;  LDL, HDL, Triglycerides, and  Waist Circumference were discussed in detail.  2) BP/HTN: His blood pressure is better, however not controlled to target.   He is not on any blood pressure medications at this time.  He will be observed without medications. 3) Lipids/HPL:   His recent lipid panel showed LDL of 101.  He is not on statins.  He will be continued off his statins for now.  4)  Weight/Diet: CDE Consult will be initiated , exercise, and detailed carbohydrates information provided.  5) hypothyroidism:  -His recent TSH is on target.  He is advised to continue Synthroid 50 mcg p.o. daily before breakfast.  - We discussed about correct intake of levothyroxine, at fasting, with water, separated by at least 30 minutes from breakfast, and separated by more than 4 hours from calcium, iron, multivitamins, acid reflux medications (PPIs). -Patient is made aware of the fact that thyroid hormone replacement is needed for life, dose to be adjusted by periodic monitoring of thyroid function tests. 6) Chronic Care/Health Maintenance:  -he  is encouraged to initiate and continue to follow up with Ophthalmology, Dentist,  Podiatrist at least yearly or according to recommendations, and advised to  stay away from smoking. I have recommended yearly flu vaccine and pneumonia vaccine at least every 5 years; moderate intensity exercise for up to 150 minutes weekly; and  sleep for at least 7 hours a day.  -  I advised patient to maintain close follow up with Doree Albee, MD for primary care needs.  - Time spent with the patient: 25 min, of which >50% was spent in reviewing his blood glucose logs , discussing his hypo- and hyper-glycemic episodes, reviewing his current and  previous labs and insulin doses and developing a plan to avoid hypo- and hyper-glycemia. Please refer to Patient  Instructions for Blood Glucose Monitoring and Insulin/Medications Dosing Guide"  in media tab for additional information. Sharlene Motts participated in the discussions, expressed understanding, and voiced agreement with the above plans.  All questions were answered to his satisfaction. he is encouraged to contact clinic should he have any questions or concerns prior to his return visit.  Follow up plan: - Return in about 6 months (around 06/13/2018) for Follow up with Pre-visit Labs, Meter, and Logs.  Glade Lloyd, MD [facility] [address] [place], [place] [address] Phone: [phone]  Fax: [phone]    12/13/2017, 5:59 PM  This note was partially dictated with voice recognition software. Similar sounding words can be transcribed inadequately or may not  be corrected upon review.

## 2017-12-22 ENCOUNTER — Telehealth: Payer: Self-pay | Admitting: "Endocrinology

## 2017-12-22 ENCOUNTER — Ambulatory Visit: Payer: Medicare Other | Admitting: "Endocrinology

## 2017-12-22 MED ORDER — PIOGLITAZONE HCL 15 MG PO TABS: 15.0000 mg | ORAL_TABLET | Freq: Every day | ORAL | 2 refills | Status: DC

## 2017-12-22 NOTE — Telephone Encounter (Signed)
Sky is asking for a  Refill on pioglitazone (ACTOS) 15 MG tablet to be sent to [facility], please advise?

## 2018-01-20 ENCOUNTER — Emergency Department (HOSPITAL_COMMUNITY)
Admission: EM | Admit: 2018-01-20 | Discharge: 2018-01-20 | Disposition: A | Discharge status: Home / Self Care | Payer: Medicare Other | Attending: Emergency Medicine | Admitting: Emergency Medicine

## 2018-01-20 ENCOUNTER — Encounter (HOSPITAL_COMMUNITY): Payer: Self-pay | Admitting: *Deleted

## 2018-01-20 ENCOUNTER — Other Ambulatory Visit: Payer: Self-pay

## 2018-01-20 DIAGNOSIS — I252 Old myocardial infarction: Secondary | ICD-10-CM | POA: Insufficient documentation

## 2018-01-20 DIAGNOSIS — E1122 Type 2 diabetes mellitus with diabetic chronic kidney disease: Secondary | ICD-10-CM | POA: Insufficient documentation

## 2018-01-20 DIAGNOSIS — E1165 Type 2 diabetes mellitus with hyperglycemia: Secondary | ICD-10-CM | POA: Diagnosis not present

## 2018-01-20 DIAGNOSIS — Z7982 Long term (current) use of aspirin: Secondary | ICD-10-CM | POA: Insufficient documentation

## 2018-01-20 DIAGNOSIS — E039 Hypothyroidism, unspecified: Secondary | ICD-10-CM | POA: Insufficient documentation

## 2018-01-20 DIAGNOSIS — I129 Hypertensive chronic kidney disease with stage 1 through stage 4 chronic kidney disease, or unspecified chronic kidney disease: Secondary | ICD-10-CM | POA: Diagnosis not present

## 2018-01-20 DIAGNOSIS — R51 Headache: Secondary | ICD-10-CM | POA: Diagnosis present

## 2018-01-20 DIAGNOSIS — N183 Chronic kidney disease, stage 3 (moderate): Secondary | ICD-10-CM | POA: Diagnosis not present

## 2018-01-20 DIAGNOSIS — G309 Alzheimer's disease, unspecified: Secondary | ICD-10-CM | POA: Diagnosis not present

## 2018-01-20 DIAGNOSIS — Z7984 Long term (current) use of oral hypoglycemic drugs: Secondary | ICD-10-CM | POA: Insufficient documentation

## 2018-01-20 DIAGNOSIS — F0280 Dementia in other diseases classified elsewhere without behavioral disturbance: Secondary | ICD-10-CM | POA: Insufficient documentation

## 2018-01-20 DIAGNOSIS — Z85828 Personal history of other malignant neoplasm of skin: Secondary | ICD-10-CM | POA: Insufficient documentation

## 2018-01-20 DIAGNOSIS — R739 Hyperglycemia, unspecified: Secondary | ICD-10-CM

## 2018-01-20 DIAGNOSIS — Z79899 Other long term (current) drug therapy: Secondary | ICD-10-CM | POA: Diagnosis not present

## 2018-01-20 LAB — BASIC METABOLIC PANEL
Anion gap: 10 (ref 5–15)
BUN: 33 mg/dL — ABNORMAL HIGH (ref 8–23)
CALCIUM: 8.9 mg/dL (ref 8.9–10.3)
CO2: 25 mmol/L (ref 22–32)
CREATININE: 1.53 mg/dL — AB (ref 0.61–1.24)
Chloride: 96 mmol/L — ABNORMAL LOW (ref 98–111)
GFR calc Af Amer: 46 mL/min — ABNORMAL LOW (ref >60)
GFR, EST NON AFRICAN AMERICAN: 39 mL/min — AB (ref >60)
Glucose, Bld: 237 mg/dL — ABNORMAL HIGH (ref 70–99)
Potassium: 3.6 mmol/L (ref 3.5–5.1)
Sodium: 131 mmol/L — ABNORMAL LOW (ref 135–145)

## 2018-01-20 LAB — URINALYSIS, ROUTINE W REFLEX MICROSCOPIC
Bacteria, UA: NONE SEEN
Bilirubin Urine: NEGATIVE
Glucose, UA: >=500 mg/dL — AB
Hgb urine dipstick: NEGATIVE
Ketones, ur: NEGATIVE mg/dL
Leukocytes, UA: NEGATIVE
Nitrite: NEGATIVE
Protein, ur: NEGATIVE mg/dL
Specific Gravity, Urine: 1.013 (ref 1.005–1.030)
pH: 5 (ref 5.0–8.0)

## 2018-01-20 LAB — CBC WITH DIFFERENTIAL/PLATELET
Abs Immature Granulocytes: 0.06 10*3/uL (ref 0.00–0.07)
BASOS PCT: 1 %
Basophils Absolute: 0.1 10*3/uL (ref 0.0–0.1)
Eosinophils Absolute: 0.3 10*3/uL (ref 0.0–0.5)
Eosinophils Relative: 4 %
HCT: 36.2 % — ABNORMAL LOW (ref 39.0–52.0)
Hemoglobin: 11.7 g/dL — ABNORMAL LOW (ref 13.0–17.0)
Immature Granulocytes: 1 %
Lymphocytes Relative: 21 %
Lymphs Abs: 1.7 10*3/uL (ref 0.7–4.0)
MCH: 29.8 pg (ref 26.0–34.0)
MCHC: 32.3 g/dL (ref 30.0–36.0)
MCV: 92.3 fL (ref 80.0–100.0)
Monocytes Absolute: 0.9 10*3/uL (ref 0.1–1.0)
Monocytes Relative: 12 %
Neutro Abs: 4.8 10*3/uL (ref 1.7–7.7)
Neutrophils Relative %: 61 %
Platelets: 213 10*3/uL (ref 150–400)
RBC: 3.92 MIL/uL — ABNORMAL LOW (ref 4.22–5.81)
RDW: 12.7 % (ref 11.5–15.5)
WBC: 7.8 10*3/uL (ref 4.0–10.5)
nRBC: 0 % (ref 0.0–0.2)

## 2018-01-20 LAB — CBG MONITORING, ED
GLUCOSE-CAPILLARY: 265 mg/dL — AB (ref 70–99)
Glucose-Capillary: 201 mg/dL — ABNORMAL HIGH (ref 70–99)

## 2018-01-20 MED ORDER — ACETAMINOPHEN 325 MG PO TABS
650.0000 mg | ORAL_TABLET | Freq: Once | ORAL | Status: DC | PRN
  Filled 2018-01-20: qty 2

## 2018-01-20 MED ORDER — SODIUM CHLORIDE 0.9 % IV BOLUS
1000.0000 mL | Freq: Once | INTRAVENOUS | Status: AC
  Administered 2018-01-20: 1000 mL via INTRAVENOUS

## 2018-01-20 NOTE — ED Provider Notes (Signed)
[facility] EMERGENCY DEPARTMENT Provider Note   CSN: [id number] Arrival date & time: 01/20/18  1845     History   Chief Complaint Chief Complaint  Patient presents with  . Hyperglycemia    HPI Jonathon Miller is a 84 y.o. male.  HPI  84 year old male presents with hyperglycemia and headache.  The patient's history is mostly taken from wife and other family at the bedside.  Patient started having a headache yesterday that he states is moderate and frontal.  Rates it as about a 5.  He states this is the typical headache he gets whenever his glucose is high.  Glucose is otherwise been running in the 120s when he first wakes up.  He states he has been eating more sugar than typical due to the holidays.  Today his wife gave him an aspirin for the headache and that before giving him dessert checked his sugar and it was 390.  Due to this, they brought him into the hospital for evaluation.  He denies any other complaints including blurry vision, fever, abdominal or chest pain, urinary symptoms or focal weakness/numbness.  He states he has had this type of headache many times before.  Past Medical History:  Diagnosis Date  . Alzheimer's dementia ([place])   . Anemia of chronic disease 03/18/2017  . Cancer (HCC)    Skin  . Cataract    skin  . CRD (chronic renal disease), stage III ([place]) 03/18/2017  . Diabetes mellitus without complication ([place])   . Gastritis   . Hyperlipidemia   . Hypertension   . Myocardial infarction ([place])   . Thyroid disease     Patient Active Problem List   Diagnosis Date Noted  . Nodular basal cell carcinoma 03/30/2017  . Fatigue 03/30/2017  . Bradycardia 03/30/2017  . Proteinuria 03/18/2017  . CRD (chronic renal disease), stage III ([place]) 03/18/2017  . Anemia of chronic disease 03/18/2017  . Type 2 diabetes mellitus with stage 3 chronic kidney disease, without long-term current use of insulin ([place]) 03/18/2017  . Alzheimer's dementia without behavioral  disturbance ([place]) 03/17/2017  . Hypothyroidism 03/17/2017  . Hypertension 03/17/2017  . Hyperlipidemia 03/17/2017  . Gait disturbance 03/17/2017  . Falls frequently 03/17/2017    Past Surgical History:  Procedure Laterality Date  . APPENDECTOMY    . EYE SURGERY          Home Medications    Prior to Admission medications   Medication Sig Start Date End Date Taking? Authorizing Provider  albuterol (PROVENTIL HFA;VENTOLIN HFA) 108 (90 Base) MCG/ACT inhaler Inhale 1 puff into the lungs every 6 (six) hours as needed for wheezing or shortness of breath. 07/12/17   [name], MD  aspirin EC 81 MG tablet Take 81 mg by mouth daily.    [provider]  citalopram (CELEXA) 20 MG tablet Take 1 tablet (20 mg total) by mouth daily. 06/10/17   [name], MD  famotidine (PEPCID) 20 MG tablet Take 1 tablet (20 mg total) by mouth 2 (two) times daily. 05/08/17   Francine Graven, DO  glipiZIDE (GLUCOTROL XL) 5 MG 24 hr tablet TAKE 2 TABLET BY MOUTH WITH BREAKFAST AND 1 TABLET AT SUPPER 12/13/17   Cassandria Anger, MD  levothyroxine (SYNTHROID) 50 MCG tablet Take 1 tablet (50 mcg total) by mouth daily before breakfast. 06/11/17   [name], MD  loratadine (CLARITIN) 10 MG tablet Take 1 tablet (10 mg total) by mouth daily. 06/11/17   [name], MD  nitroGLYCERIN (NITROSTAT)  0.4 MG SL tablet Place 0.4 mg under the tongue every 5 (five) minutes as needed for chest pain.    [provider]  pioglitazone (ACTOS) 15 MG tablet Take 1 tablet (15 mg total) by mouth daily. 12/22/17   Cassandria Anger, MD  TRADJENTA 5 MG TABS tablet TAKE 1 TABLET BY MOUTH EVERY DAY 09/02/17   [name], MD  vitamin C (ASCORBIC ACID) 500 MG tablet Take 500 mg by mouth daily.    [provider]    Family History Family History  Problem Relation Age of Onset  . Diabetes Other     Social History Social History   Tobacco Use  . Smoking status: Never Smoker  . Smokeless  tobacco: Never Used  Substance Use Topics  . Alcohol use: No    Frequency: Never  . Drug use: No     Allergies   Penicillins   Review of Systems Review of Systems  Constitutional: Negative for fever.  Eyes: Negative for visual disturbance.  Respiratory: Negative for shortness of breath.   Cardiovascular: Negative for chest pain.  Gastrointestinal: Negative for abdominal pain and vomiting.  Genitourinary: Negative for dysuria.  Neurological: Positive for headaches. Negative for dizziness, weakness and numbness.     Physical Exam Updated Vital Signs BP (!) 151/56 (BP Location: Right Arm)   Pulse 71   Temp (!) 97 F (36.1 C) (Temporal)   Resp 16   Ht 5\' 1"  (1.549 m)   Wt 59 kg   SpO2 95%   BMI 24.56 kg/m   Physical Exam Vitals signs and nursing note reviewed.  Constitutional:      General: He is not in acute distress.    Appearance: He is well-developed. He is not ill-appearing or diaphoretic.  HENT:     Head: Normocephalic and atraumatic.     Right Ear: External ear normal.     Left Ear: External ear normal.     Nose: Nose normal.  Eyes:     General:        Right eye: No discharge.        Left eye: No discharge.     Extraocular Movements: Extraocular movements intact.  Neck:     Musculoskeletal: Neck supple.  Cardiovascular:     Rate and Rhythm: Normal rate and regular rhythm.     Heart sounds: Normal heart sounds.  Pulmonary:     Effort: Pulmonary effort is normal.     Breath sounds: Normal breath sounds.  Abdominal:     Palpations: Abdomen is soft.     Tenderness: There is no abdominal tenderness.  Skin:    General: Skin is warm and dry.  Neurological:     Mental Status: He is alert.     Comments: Awake, alert, oriented to person, place. Disoriented to time. CN 3-12 grossly intact. 5/5 strength in all 4 extremities. Grossly normal sensation. Normal finger to nose.   Psychiatric:        Mood and Affect: Mood is not anxious.      ED Treatments /  Results  Labs (all labs ordered are listed, but only abnormal results are displayed) Labs Reviewed  BASIC METABOLIC PANEL - Abnormal; Notable for the following components:      Result Value   Sodium 131 (*)    Chloride 96 (*)    Glucose, Bld 237 (*)    BUN 33 (*)    Creatinine, Ser 1.53 (*)    GFR calc non Af Wyvonnia Lora  39 (*)    GFR calc Af Amer 46 (*)    All other components within normal limits  CBC WITH DIFFERENTIAL/PLATELET - Abnormal; Notable for the following components:   RBC 3.92 (*)    Hemoglobin 11.7 (*)    HCT 36.2 (*)    All other components within normal limits  URINALYSIS, ROUTINE W REFLEX MICROSCOPIC - Abnormal; Notable for the following components:   Glucose, UA >=500 (*)    All other components within normal limits  CBG MONITORING, ED - Abnormal; Notable for the following components:   Glucose-Capillary 265 (*)    All other components within normal limits  CBG MONITORING, ED - Abnormal; Notable for the following components:   Glucose-Capillary 201 (*)    All other components within normal limits    EKG None  Radiology No results found.  Procedures Procedures (including critical care time)  Medications Ordered in ED Medications  acetaminophen (TYLENOL) tablet 650 mg (has no administration in time range)  sodium chloride 0.9 % bolus 1,000 mL (1,000 mLs Intravenous New Bag/Given 01/20/18 2102)     Initial Impression / Assessment and Plan / ED Course  I have reviewed the triage vital signs and the nursing notes.  Pertinent labs & imaging results that were available during my care of the patient were reviewed by me and considered in my medical decision making (see chart for details).     Patient's glucose is already lower than what it was at home.  His lab work and urinalysis are not consistent with DKA.  Other labs are near baseline.  He does not have any other cause of why he would go into acute hyperglycemia, save for likely poor diet control around the  Christmas holiday.  He does have a mild to moderate headache but he states he often gets this and with no trauma or focal neurologic deficits or new confusion, I think it is unlikely has an acute CNS emergency.  With fluids his headache resolved.  He appears stable for discharge home and I have instructed him to call his endocrinologist to help with outpatient glucose control.  Final Clinical Impressions(s) / ED Diagnoses   Final diagnoses:  Hyperglycemia    ED Discharge Orders    None       Sherwood Gambler, MD 01/20/18 2204

## 2018-01-20 NOTE — Discharge Instructions (Signed)
If you develop a severe or worsening headache, vomiting, blurry vision, weakness or numbness in the arms/legs, or any other new/concerning symptoms then return to the ER for evaluation.

## 2018-01-20 NOTE — ED Notes (Signed)
Pt ambulatory to waiting room. Pt verbalized understanding of discharge instructions.   

## 2018-01-20 NOTE — ED Triage Notes (Signed)
Pt's cbg readings have been in the upper 300's for the last couple of days with a headache; pt states he usually gets a headache when his cbg reads high

## 2018-01-21 ENCOUNTER — Telehealth: Payer: Self-pay

## 2018-01-21 NOTE — Telephone Encounter (Signed)
Jonathon Miller was in the [facility] ER yesterday with elevated blood sugars and the family is just wanting to make you aware

## 2018-02-13 ENCOUNTER — Other Ambulatory Visit: Payer: Self-pay | Admitting: Family Medicine

## 2018-02-13 DIAGNOSIS — E118 Type 2 diabetes mellitus with unspecified complications: Principal | ICD-10-CM

## 2018-02-13 DIAGNOSIS — E1165 Type 2 diabetes mellitus with hyperglycemia: Secondary | ICD-10-CM

## 2018-02-13 DIAGNOSIS — IMO0002 Reserved for concepts with insufficient information to code with codable children: Secondary | ICD-10-CM

## 2018-03-06 ENCOUNTER — Other Ambulatory Visit: Payer: Self-pay | Admitting: "Endocrinology

## 2018-03-15 ENCOUNTER — Other Ambulatory Visit: Payer: Self-pay | Admitting: "Endocrinology

## 2018-06-14 ENCOUNTER — Ambulatory Visit: Payer: Medicare Other | Admitting: "Endocrinology

## 2018-11-06 ENCOUNTER — Encounter (INDEPENDENT_AMBULATORY_CARE_PROVIDER_SITE_OTHER): Payer: Self-pay | Admitting: Internal Medicine

## 2018-11-15 ENCOUNTER — Encounter (INDEPENDENT_AMBULATORY_CARE_PROVIDER_SITE_OTHER): Payer: Self-pay | Admitting: Internal Medicine

## 2018-11-15 ENCOUNTER — Ambulatory Visit (INDEPENDENT_AMBULATORY_CARE_PROVIDER_SITE_OTHER): Payer: Medicare Other | Admitting: Internal Medicine

## 2018-11-15 ENCOUNTER — Other Ambulatory Visit: Payer: Self-pay

## 2018-11-15 VITALS — BP 130/70 | HR 68 | Temp 98.2°F | Ht 61.0 in | Wt 142.0 lb

## 2018-11-15 DIAGNOSIS — E039 Hypothyroidism, unspecified: Secondary | ICD-10-CM | POA: Diagnosis not present

## 2018-11-15 DIAGNOSIS — E1122 Type 2 diabetes mellitus with diabetic chronic kidney disease: Secondary | ICD-10-CM

## 2018-11-15 DIAGNOSIS — G309 Alzheimer's disease, unspecified: Secondary | ICD-10-CM

## 2018-11-15 DIAGNOSIS — I10 Essential (primary) hypertension: Secondary | ICD-10-CM

## 2018-11-15 DIAGNOSIS — F0280 Dementia in other diseases classified elsewhere without behavioral disturbance: Secondary | ICD-10-CM

## 2018-11-15 DIAGNOSIS — E1121 Type 2 diabetes mellitus with diabetic nephropathy: Secondary | ICD-10-CM | POA: Diagnosis not present

## 2018-11-15 DIAGNOSIS — N1831 Chronic kidney disease, stage 3a: Secondary | ICD-10-CM

## 2018-11-15 NOTE — Progress Notes (Signed)
Wellness Office Visit  Subjective:  Patient ID: Jonathon Miller, male    DOB: 11-23-1925  Age: 85 y.o. MRN: [id number]  CC: This man comes back after 1 year hiatus when he was living in [place].  He is here so I can fill [place] 2 form for him to be admitted to a assisted living facility/memory care unit in [place]. HPI He has underlying type 2 diabetes which is uncontrolled from the last time we took blood work from him.  Since being in [place], he is now on insulin twice a day.  According to his daughter, who is present with him, he does not really adhere to any kind of diet.  Diabetes is affected kidney function and his last creatinine was in the 1.4 range. He also has hypertension for which he takes medications. He also has hypothyroidism for which he takes medication. As far as his Alzheimer's dementia is concerned, he does need help with most activities of daily living, he is unable to take his own medicine by himself, has had several falls whilst he was in [place] but thankfully did not break his hip.  He also has had intermittent incontinence of urine and feces. Past Medical History:  Diagnosis Date  . Alzheimer's dementia ([place])   . Anemia of chronic disease 03/18/2017  . Cancer (HCC)    Skin  . Cataract    skin  . CRD (chronic renal disease), stage III 03/18/2017  . Diabetes mellitus without complication ([place])   . Gastritis   . Hyperlipidemia   . Hypertension   . Myocardial infarction ([place])   . Thyroid disease       Family History  Problem Relation Age of Onset  . Diabetes Other     Social History   Social History Narrative   Just moved to [place] with wife [name], wife of 24 years (second)   Lives with Daughter [name] who helps care for them   Likes to Golf-not able anymore   Deer Hunting-not able anymore   Prior occupation was baking/cooking        Current Meds  Medication Sig  . albuterol (PROVENTIL HFA;VENTOLIN HFA) 108 (90 Base) MCG/ACT  inhaler Inhale 1 puff into the lungs every 6 (six) hours as needed for wheezing or shortness of breath.  [name] aspirin EC 81 MG tablet Take 81 mg by mouth daily.  . citalopram (CELEXA) 20 MG tablet Take 1 tablet (20 mg total) by mouth daily.  . famotidine (PEPCID) 20 MG tablet Take 1 tablet (20 mg total) by mouth 2 (two) times daily.  . Insulin Detemir (LEVEMIR) 100 UNIT/ML Pen Inject 21 Units into the skin daily. Inject 21 units every  AM and 15 units at bedtime.  [name] levothyroxine (SYNTHROID) 50 MCG tablet Take 1 tablet (50 mcg total) by mouth daily before breakfast.  . loratadine (CLARITIN) 10 MG tablet Take 1 tablet (10 mg total) by mouth daily.  . nitroGLYCERIN (NITROSTAT) 0.4 MG SL tablet Place 0.4 mg under the tongue every 5 (five) minutes as needed for chest pain.  . pioglitazone (ACTOS) 15 MG tablet TAKE 1 TABLET BY MOUTH EVERY DAY  . tamsulosin (FLOMAX) 0.4 MG CAPS capsule Take 0.4 mg by mouth daily.  . TRADJENTA 5 MG TABS tablet TAKE 1 TABLET BY MOUTH EVERY DAY  . vitamin C (ASCORBIC ACID) 500 MG tablet Take 500 mg by mouth daily.      Objective:   Today's Vitals: BP 130/70 (BP Location: Right Arm, Patient  Position: Sitting, Cuff Size: Normal)   Pulse 68   Temp 98.2 F (36.8 C) (Temporal)   Ht 5\' 1"  (1.549 m)   Wt 142 lb (64.4 kg)   BMI 26.83 kg/m  Vitals with BMI 11/15/2018 01/20/2018 12/13/2017  Height 5\' 1"  5\' 1"  5\' 3"   Weight 142 lbs 130 lbs 150 lbs  BMI 26.84 0000000 [id number]  Systolic [id number]  Diastolic 70 56 68  Pulse 68 71 61     Physical Exam   He appears to be alert.  Blood pressure is well controlled.  Heart sounds are present with soft systolic murmur at the apex.  Lung fields are clear.  There are no obvious new focal neurological signs.    Assessment   1. Essential hypertension   2. Acquired hypothyroidism   3. Type 2 diabetes mellitus with stage 3a chronic kidney disease, without long-term current use of insulin ([place])   4. Alzheimer's dementia without  behavioral disturbance, unspecified timing of dementia onset ([place])       Tests ordered No orders of the defined types were placed in this encounter.    Plan: 1. He will continue with all medications for hypertension 2. He will continue with levothyroxine for his hypothyroidism. 3. He will continue with insulin as well as oral hypoglycemic agents for his diabetes. 4. I have filled out the [place] 2 form.   No orders of the defined types were placed in this encounter.   Doree Albee, MD

## 2018-11-24 ENCOUNTER — Emergency Department (HOSPITAL_COMMUNITY)
Admission: EM | Admit: 2018-11-24 | Discharge: 2018-11-24 | Disposition: A | Discharge status: Home / Self Care | Payer: Medicare Other | Attending: Emergency Medicine | Admitting: Emergency Medicine

## 2018-11-24 ENCOUNTER — Emergency Department (HOSPITAL_COMMUNITY): Payer: Medicare Other

## 2018-11-24 ENCOUNTER — Other Ambulatory Visit: Payer: Self-pay

## 2018-11-24 DIAGNOSIS — N183 Chronic kidney disease, stage 3 unspecified: Secondary | ICD-10-CM | POA: Insufficient documentation

## 2018-11-24 DIAGNOSIS — S0990XA Unspecified injury of head, initial encounter: Secondary | ICD-10-CM | POA: Diagnosis not present

## 2018-11-24 DIAGNOSIS — Y92128 Other place in nursing home as the place of occurrence of the external cause: Secondary | ICD-10-CM | POA: Diagnosis not present

## 2018-11-24 DIAGNOSIS — W01198A Fall on same level from slipping, tripping and stumbling with subsequent striking against other object, initial encounter: Secondary | ICD-10-CM | POA: Diagnosis not present

## 2018-11-24 DIAGNOSIS — G309 Alzheimer's disease, unspecified: Secondary | ICD-10-CM | POA: Diagnosis not present

## 2018-11-24 DIAGNOSIS — Z794 Long term (current) use of insulin: Secondary | ICD-10-CM | POA: Diagnosis not present

## 2018-11-24 DIAGNOSIS — I129 Hypertensive chronic kidney disease with stage 1 through stage 4 chronic kidney disease, or unspecified chronic kidney disease: Secondary | ICD-10-CM | POA: Diagnosis not present

## 2018-11-24 DIAGNOSIS — Y999 Unspecified external cause status: Secondary | ICD-10-CM | POA: Insufficient documentation

## 2018-11-24 DIAGNOSIS — Z79899 Other long term (current) drug therapy: Secondary | ICD-10-CM | POA: Diagnosis not present

## 2018-11-24 DIAGNOSIS — E1122 Type 2 diabetes mellitus with diabetic chronic kidney disease: Secondary | ICD-10-CM | POA: Insufficient documentation

## 2018-11-24 DIAGNOSIS — F0280 Dementia in other diseases classified elsewhere without behavioral disturbance: Secondary | ICD-10-CM | POA: Insufficient documentation

## 2018-11-24 DIAGNOSIS — Z23 Encounter for immunization: Secondary | ICD-10-CM | POA: Diagnosis not present

## 2018-11-24 DIAGNOSIS — Y9389 Activity, other specified: Secondary | ICD-10-CM | POA: Diagnosis not present

## 2018-11-24 DIAGNOSIS — Z7982 Long term (current) use of aspirin: Secondary | ICD-10-CM | POA: Diagnosis not present

## 2018-11-24 DIAGNOSIS — W19XXXA Unspecified fall, initial encounter: Secondary | ICD-10-CM

## 2018-11-24 LAB — CBG MONITORING, ED: Glucose-Capillary: 160 mg/dL — ABNORMAL HIGH (ref 70–99)

## 2018-11-24 MED ORDER — TETANUS-DIPHTH-ACELL PERTUSSIS 5-2.5-18.5 LF-MCG/0.5 IM SUSP
0.5000 mL | Freq: Once | INTRAMUSCULAR | Status: AC
  Administered 2018-11-24: 0.5 mL via INTRAMUSCULAR
  Filled 2018-11-24: qty 0.5

## 2018-11-24 NOTE — ED Provider Notes (Signed)
TIME SEEN: 2:54 AM  CHIEF COMPLAINT: Fall  HPI: Patient is a 85 year old male with history of Alzheimer's dementia, hypertension, hyperlipidemia, insulin-dependent diabetes, chronic kidney disease who presents to the emergency department from the [place] [facility] after he had an unwitnessed fall.  Patient states that he lost his balance and fell.  States this is normal for him and uses a walker.  Unsure of last tetanus vaccination.  Denies any pain currently.  Patient is on baby aspirin but no other antiplatelet or anticoagulant.  ROS: Level 5 caveat secondary to dementia  PAST MEDICAL HISTORY/PAST SURGICAL HISTORY:  Past Medical History:  Diagnosis Date  . Alzheimer's dementia ([place])   . Anemia of chronic disease 03/18/2017  . Cancer (HCC)    Skin  . Cataract    skin  . CRD (chronic renal disease), stage III 03/18/2017  . Diabetes mellitus without complication ([place])   . Gastritis   . Hyperlipidemia   . Hypertension   . Myocardial infarction ([place])   . Thyroid disease     MEDICATIONS:  Prior to Admission medications   Medication Sig Start Date End Date Taking? Authorizing Provider  albuterol (PROVENTIL HFA;VENTOLIN HFA) 108 (90 Base) MCG/ACT inhaler Inhale 1 puff into the lungs every 6 (six) hours as needed for wheezing or shortness of breath. 07/12/17   [name], MD  aspirin EC 81 MG tablet Take 81 mg by mouth daily.    [provider]  citalopram (CELEXA) 20 MG tablet Take 1 tablet (20 mg total) by mouth daily. 06/10/17   [name], MD  famotidine (PEPCID) 20 MG tablet Take 1 tablet (20 mg total) by mouth 2 (two) times daily. 05/08/17   Francine Graven, DO  Insulin Detemir (LEVEMIR) 100 UNIT/ML Pen Inject 21 Units into the skin daily. Inject 21 units every  AM and 15 units at bedtime.    [provider]  levothyroxine (SYNTHROID) 50 MCG tablet Take 1 tablet (50 mcg total) by mouth daily before breakfast. 06/11/17   [name], MD  loratadine  (CLARITIN) 10 MG tablet Take 1 tablet (10 mg total) by mouth daily. 06/11/17   [name], MD  nitroGLYCERIN (NITROSTAT) 0.4 MG SL tablet Place 0.4 mg under the tongue every 5 (five) minutes as needed for chest pain.    [provider]  pioglitazone (ACTOS) 15 MG tablet TAKE 1 TABLET BY MOUTH EVERY DAY 03/16/18   Cassandria Anger, MD  tamsulosin (FLOMAX) 0.4 MG CAPS capsule Take 0.4 mg by mouth daily.    [provider]  TRADJENTA 5 MG TABS tablet TAKE 1 TABLET BY MOUTH EVERY DAY 09/02/17   [name], MD  vitamin C (ASCORBIC ACID) 500 MG tablet Take 500 mg by mouth daily.    [provider]    ALLERGIES:  Allergies  Allergen Reactions  . Penicillins Other (See Comments)    childhood    SOCIAL HISTORY:  Social History   Tobacco Use  . Smoking status: Never Smoker  . Smokeless tobacco: Never Used  Substance Use Topics  . Alcohol use: No    Frequency: Never    FAMILY HISTORY: Family History  Problem Relation Age of Onset  . Diabetes Other     EXAM: BP (!) 175/96 (BP Location: Left Arm)   Pulse (!) 101 Comment: Simultaneous filing. User may not have seen previous data.  Temp 98.2 F (36.8 C) (Oral)   Resp 18   Ht 5\' 4"  (1.626 m)   Wt 65.8 kg  SpO2 95% Comment: Simultaneous filing. User may not have seen previous data.  BMI 24.89 kg/m  CONSTITUTIONAL: Alert and oriented person but not place or time and responds appropriately to questions. Well-appearing; well-nourished; elderly, no distress HEAD: Normocephalic; small abrasion across the bridge of the nose EYES: Conjunctivae clear, PERRL, EOMI ENT: normal nose; no rhinorrhea; moist mucous membranes; pharynx without lesions noted; no dental injury; no septal hematoma NECK: Supple, no meningismus, no LAD; no midline spinal tenderness, step-off or deformity; trachea midline CARD: RRR; S1 and S2 appreciated; no murmurs, no clicks, no rubs, no gallops RESP: Normal chest excursion without  splinting or tachypnea; breath sounds clear and equal bilaterally; no wheezes, no rhonchi, no rales; no hypoxia or respiratory distress CHEST:  chest wall stable, no crepitus or ecchymosis or deformity, nontender to palpation; no flail chest ABD/GI: Normal bowel sounds; non-distended; soft, non-tender, no rebound, no guarding; no ecchymosis or other lesions noted PELVIS:  stable, nontender to palpation BACK:  The back appears normal and is non-tender to palpation, there is no CVA tenderness; no midline spinal tenderness, step-off or deformity EXT: Normal ROM in all joints; non-tender to palpation; no edema; normal capillary refill; no cyanosis, no bony tenderness or bony deformity of patient's extremities, no joint effusion, compartments are soft, extremities are warm and well-perfused, no ecchymosis SKIN: Normal color for age and race; warm, small skin tear and abrasion to the right elbow and left dorsal hand over the fifth MCP NEURO: Moves all extremities equally, reports normal sensation diffusely, cranial nerves II through XII intact, normal speech, normal gait PSYCH: The patient's mood and manner are appropriate. Grooming and personal hygiene are appropriate.  MEDICAL DECISION MAKING: Patient here with unwitnessed fall.  States he lost his balance.  Afebrile here.  Neurologically intact.  Not in distress.  Will update tetanus vaccination and obtain screening CT head and cervical spine.  No lacerations that need repair.  Anticipate discharge back to his nursing facility.  ED PROGRESS: CT imaging shows no acute abnormality.  Blood sugar is 160.  He has no complaints.  Wounds have been cleaned and dressed.  Will discharge back to nursing facility.   At this time, I do not feel there is any life-threatening condition present. I have reviewed and discussed all results (EKG, imaging, lab, urine as appropriate) and exam findings with patient/family. I have reviewed nursing notes and appropriate previous  records.  I feel the patient is safe to be discharged home without further emergent workup and can continue workup as an outpatient as needed. Discussed usual and customary return precautions. Patient/family verbalize understanding and are comfortable with this plan.  Outpatient follow-up has been provided as needed. All questions have been answered.    Jonathon Miller was evaluated in Emergency Department on 11/24/2018 for the symptoms described in the history of present illness. He was evaluated in the context of the global COVID-19 pandemic, which necessitated consideration that the patient might be at risk for infection with the SARS-CoV-2 virus that causes COVID-19. Institutional protocols and algorithms that pertain to the evaluation of patients at risk for COVID-19 are in a state of rapid change based on information released by regulatory bodies including the CDC and federal and state organizations. These policies and algorithms were followed during the patient's care in the ED.     Jonathon Miller, Delice Bison, DO 11/24/18 [id number]

## 2018-11-24 NOTE — ED Triage Notes (Signed)
Pt presents to ED via [facility] coming from [place] at [facility] for an witnessed fall. Pt has a laceration to the bridge of his nose from his glasses. Pt has a hx of alzheimer's and per staff on scene pt is alert to baseline. EMS reports no other obvious injuries or deformities and that pt has no c/o of injury or illness either.

## 2018-11-24 NOTE — Discharge Instructions (Signed)
Your head and cervical spine scans today were normal.  Your blood glucose was normal.

## 2018-11-24 NOTE — ED Notes (Signed)
Pt transported to CT ?

## 2018-11-24 NOTE — ED Notes (Signed)
Pt ambulated to restroom with staff assist without incident.

## 2018-12-15 ENCOUNTER — Other Ambulatory Visit (INDEPENDENT_AMBULATORY_CARE_PROVIDER_SITE_OTHER): Payer: Self-pay | Admitting: Internal Medicine

## 2019-02-09 IMAGING — DX DG CHEST 2V
2 series · 2 of 2 positions shown · non-contrast
Comparison: 05/08/2017.

CLINICAL DATA: Cough.

EXAM:
CHEST - 2 VIEW

[chest lat]
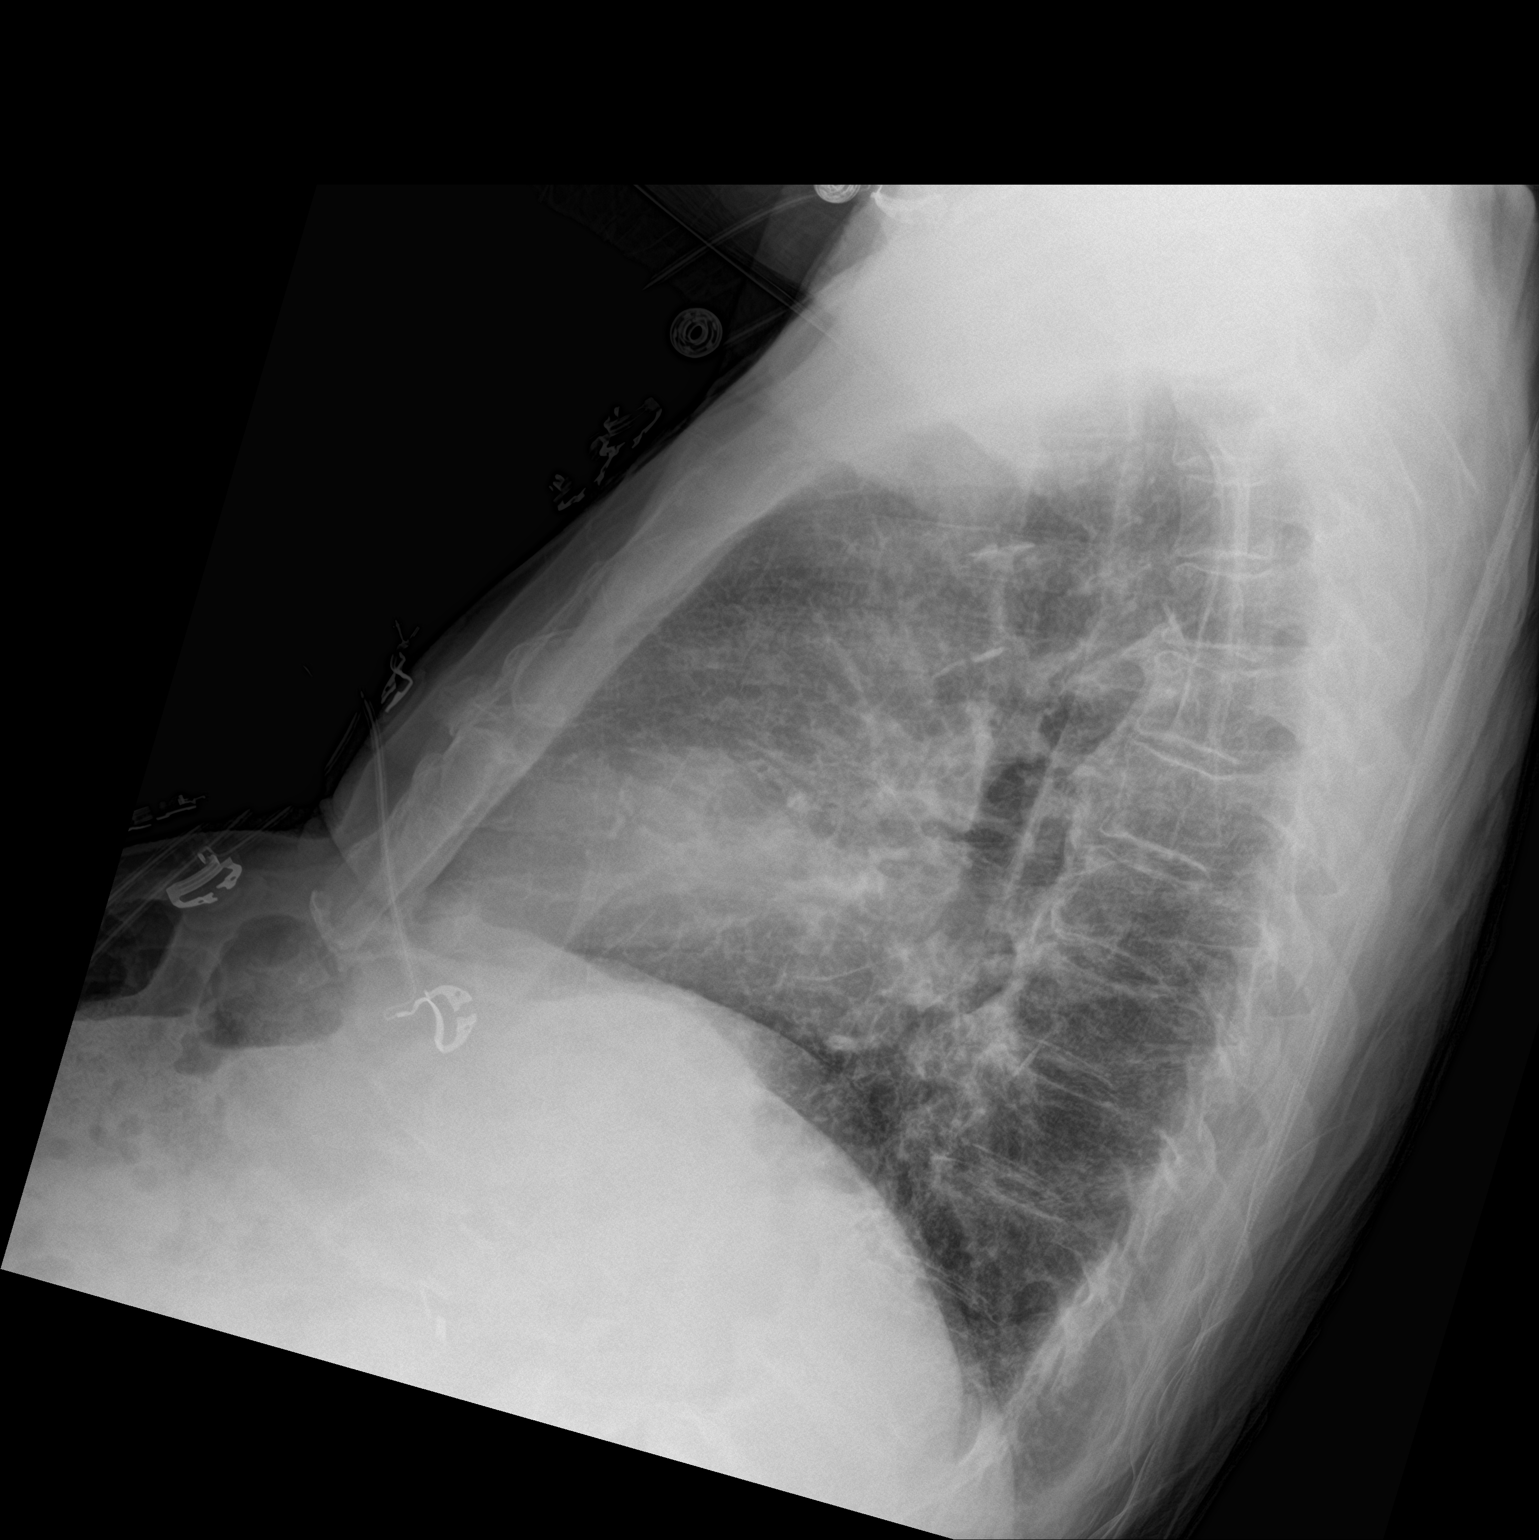

[chest ap]
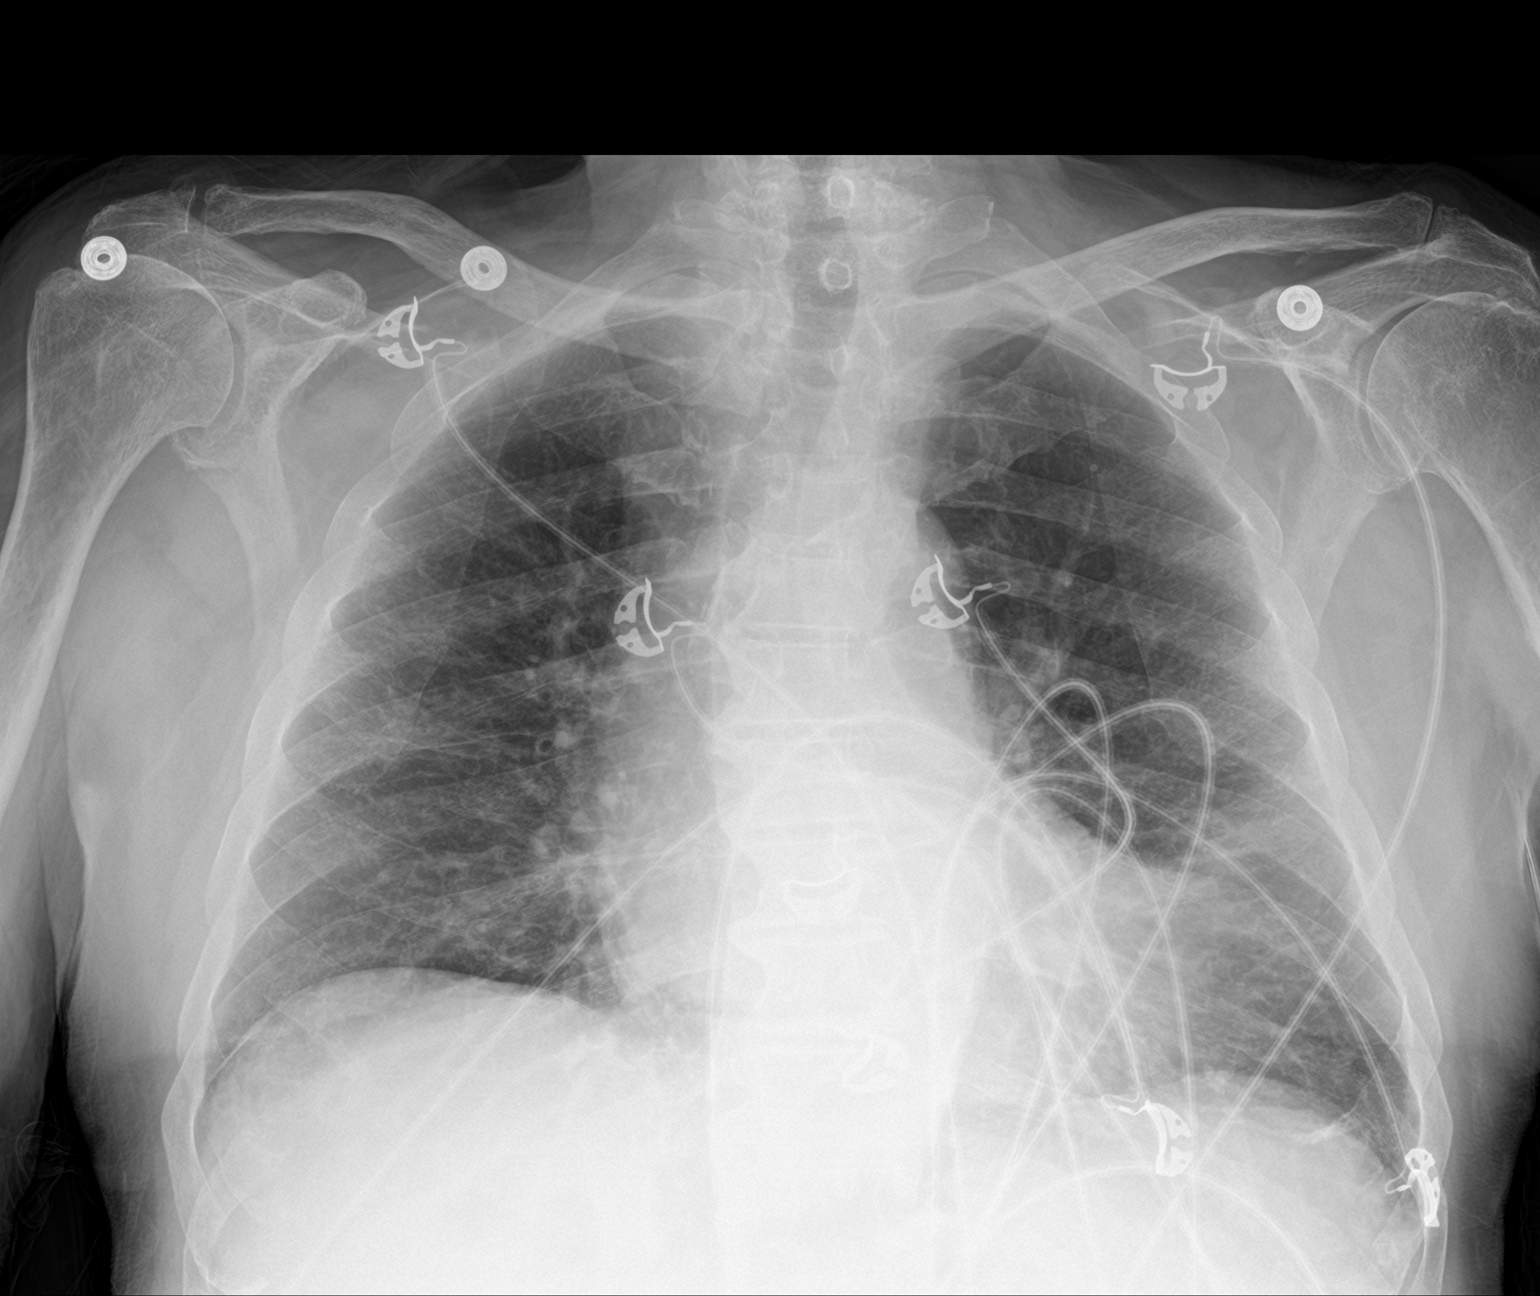

[2 of 2 positions shown; findings below may reference images not displayed]

FINDINGS: Normal sized heart. Tortuous and calcified thoracic aorta. Clear
lungs. Mild peribronchial thickening. Diffuse osteopenia and minimal
right shoulder degenerative changes. Thoracic spine degenerative
changes. Cholecystectomy clips.
IMPRESSION: Mild bronchitic changes.

## 2019-02-09 IMAGING — CT CT CERVICAL SPINE W/O CM
3 of 7 series · 12 of 33 positions shown, 13 images · non-contrast
Comparison: None.

CLINICAL DATA: Neck and back pain, numbness in hands and feet

EXAM:
CT HEAD WITHOUT CONTRAST
CT CERVICAL SPINE WITHOUT CONTRAST
TECHNIQUE: Multidetector CT imaging of the head and cervical spine was
performed following the standard protocol without intravenous
contrast. Multiplanar CT image reconstructions of the cervical spine
were also generated.

[Series 9: sagittal bone · sagittal · 0.26mm/px · 5 of 61 slices shown]
[im 11/61  bone]
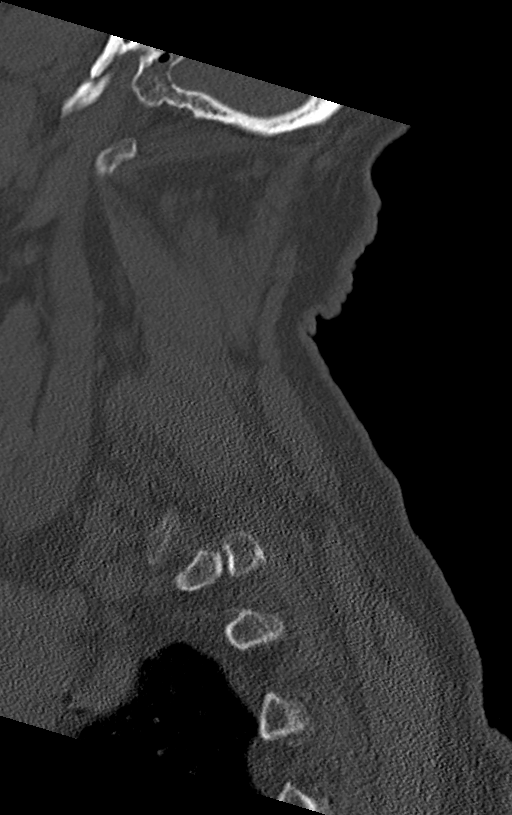
[im 21/61  bone]
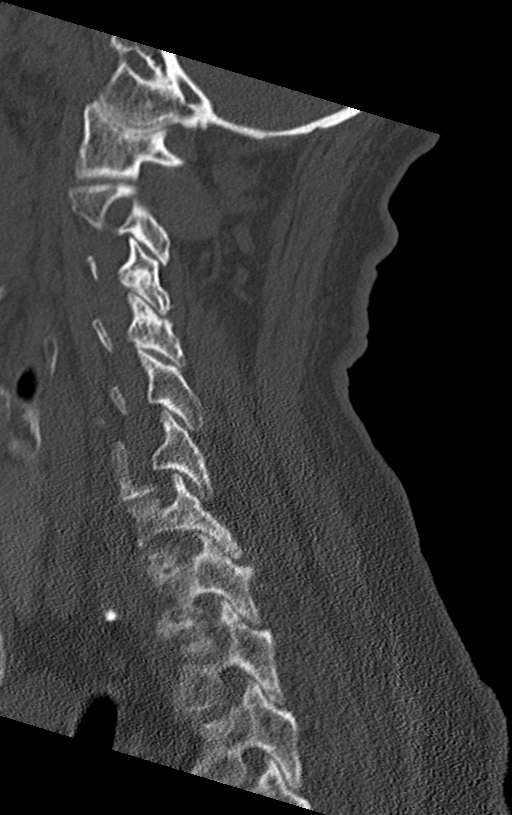
[im 31/61  bone]
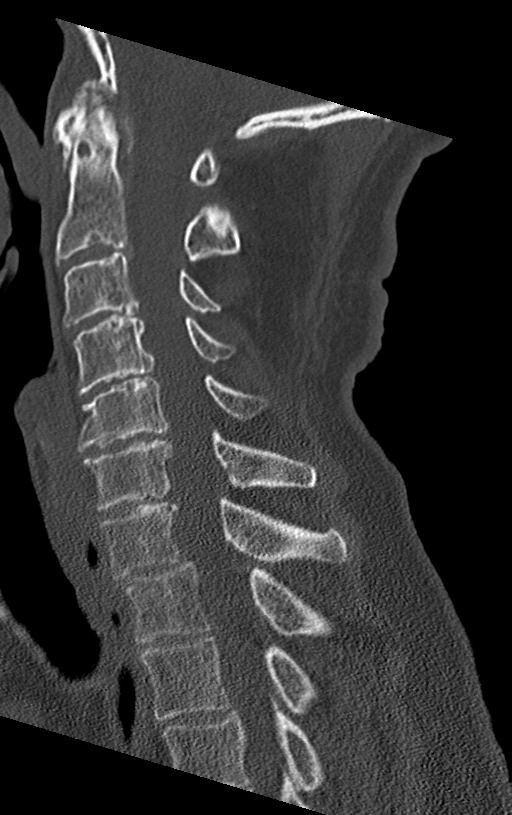
[im 41/61  bone]
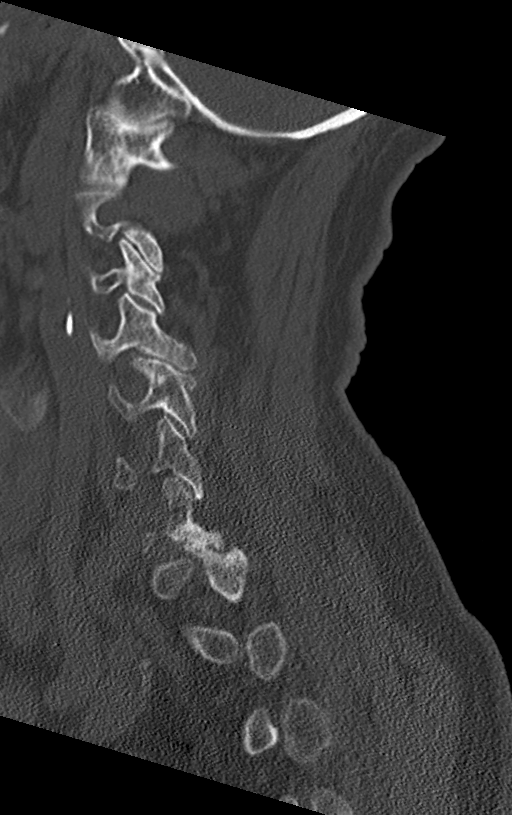
[im 51/61  bone]
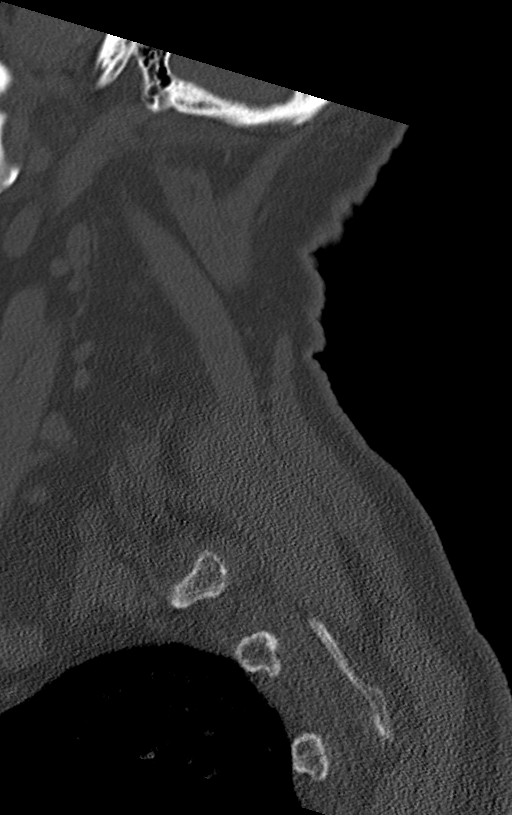

[Series 10: coronal bone · coronal · 0.26mm/px · 3 of 61 slices shown]
[im 16/61  bone]
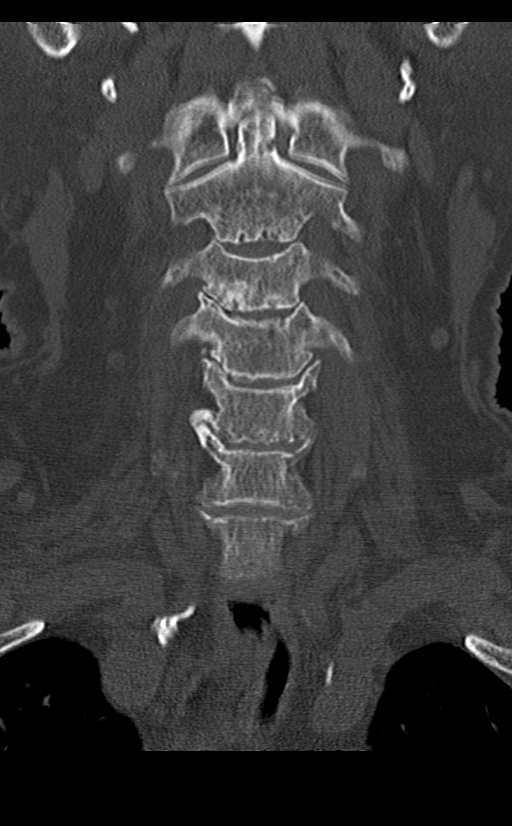
[im 31/61  bone]
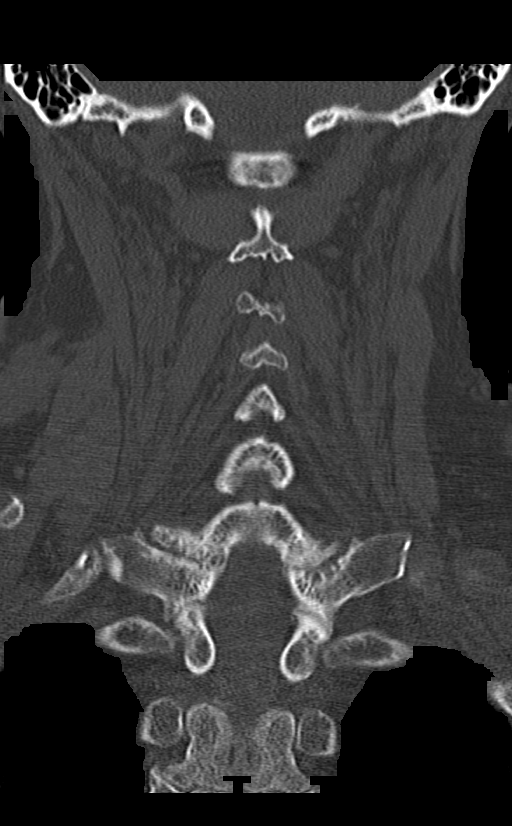
[im 46/61  bone]
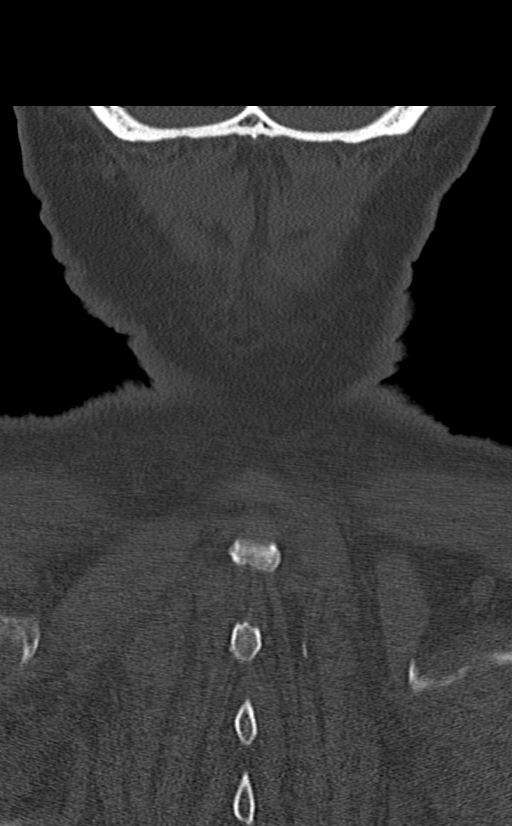

[Series 13: orthogonal bone · axial · 0.21mm/px · z∈[-133,-34]mm · 4 of 99 slices shown, 5 images]
[im 20/99  soft-tissue]
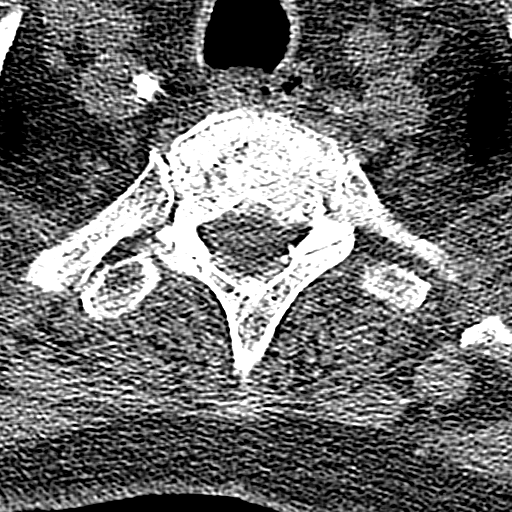
[im 20/99  bone]
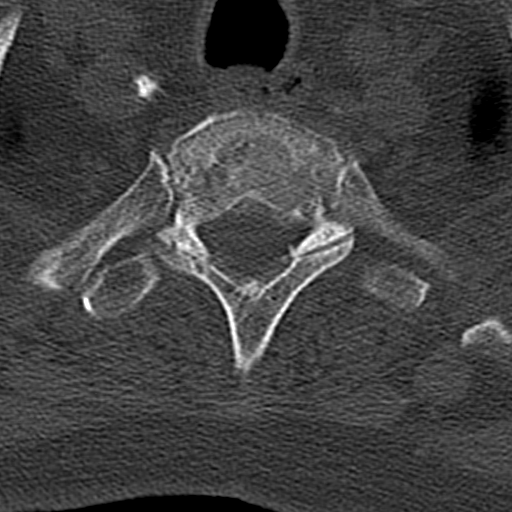
[im 40/99  bone]
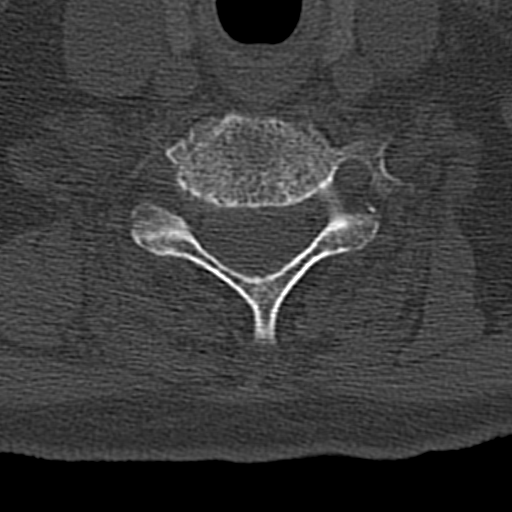
[im 59/99  bone]
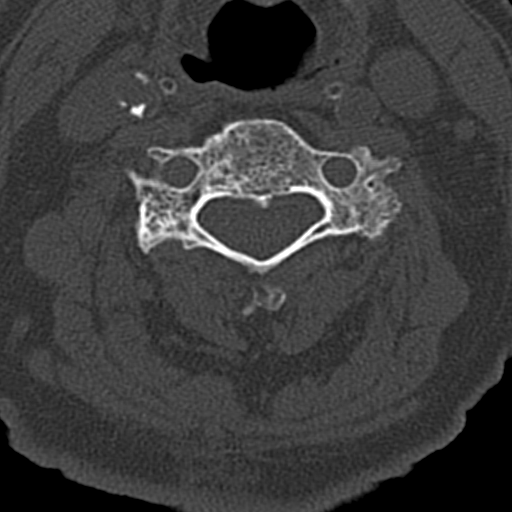
[im 79/99  bone]
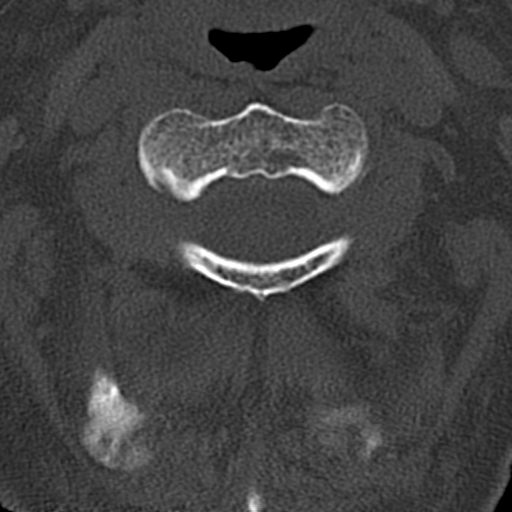

[12 of 33 positions shown; findings below may reference images not displayed]

FINDINGS: CT HEAD FINDINGS

Brain: No evidence of acute infarction, hemorrhage, extra-axial
collection or mass lesion/mass effect.

Mild cortical and central atrophy. Secondary ventricular prominence.

Vascular: Intracranial atherosclerosis.

Skull: Normal. Negative for fracture or focal lesion.

Sinuses/Orbits: The visualized paranasal sinuses are essentially
clear. The mastoid air cells are unopacified.

Other: None.

CT CERVICAL SPINE FINDINGS

Alignment: Normal cervical lordosis.

Skull base and vertebrae: No acute fracture. No primary bone lesion
or focal pathologic process.

Soft tissues and spinal canal: No prevertebral fluid or swelling. No
visible canal hematoma.

Disc levels:  Moderate degenerative changes at C4-5 and C5-6.

Mild neural foraminal narrowing on the right at C3-4 and on the left
at C4-5.

Spinal canal is patent.

Upper chest: Visualized lung apices are clear.

Other: Visualized thyroid is unremarkable.
IMPRESSION: No evidence of acute intracranial abnormality. Mild cortical
atrophy.

Mild to moderate degenerative changes of the mid cervical spine, as
above.

## 2019-06-27 ENCOUNTER — Encounter (HOSPITAL_COMMUNITY): Payer: Self-pay | Admitting: Emergency Medicine

## 2019-06-27 ENCOUNTER — Emergency Department (HOSPITAL_COMMUNITY)
Admission: EM | Admit: 2019-06-27 | Discharge: 2019-06-27 | Disposition: A | Discharge status: Home / Self Care | Payer: Medicare Other | Attending: Emergency Medicine | Admitting: Emergency Medicine

## 2019-06-27 ENCOUNTER — Other Ambulatory Visit: Payer: Self-pay

## 2019-06-27 DIAGNOSIS — Z043 Encounter for examination and observation following other accident: Secondary | ICD-10-CM | POA: Diagnosis not present

## 2019-06-27 DIAGNOSIS — Y92129 Unspecified place in nursing home as the place of occurrence of the external cause: Secondary | ICD-10-CM | POA: Diagnosis not present

## 2019-06-27 DIAGNOSIS — Z85828 Personal history of other malignant neoplasm of skin: Secondary | ICD-10-CM | POA: Diagnosis not present

## 2019-06-27 DIAGNOSIS — W19XXXA Unspecified fall, initial encounter: Secondary | ICD-10-CM | POA: Insufficient documentation

## 2019-06-27 DIAGNOSIS — Y998 Other external cause status: Secondary | ICD-10-CM | POA: Insufficient documentation

## 2019-06-27 DIAGNOSIS — Z7982 Long term (current) use of aspirin: Secondary | ICD-10-CM | POA: Diagnosis not present

## 2019-06-27 DIAGNOSIS — Z79899 Other long term (current) drug therapy: Secondary | ICD-10-CM | POA: Diagnosis not present

## 2019-06-27 DIAGNOSIS — Y9389 Activity, other specified: Secondary | ICD-10-CM | POA: Insufficient documentation

## 2019-06-27 DIAGNOSIS — I252 Old myocardial infarction: Secondary | ICD-10-CM | POA: Diagnosis not present

## 2019-06-27 DIAGNOSIS — I129 Hypertensive chronic kidney disease with stage 1 through stage 4 chronic kidney disease, or unspecified chronic kidney disease: Secondary | ICD-10-CM | POA: Insufficient documentation

## 2019-06-27 DIAGNOSIS — E1122 Type 2 diabetes mellitus with diabetic chronic kidney disease: Secondary | ICD-10-CM | POA: Diagnosis not present

## 2019-06-27 DIAGNOSIS — E039 Hypothyroidism, unspecified: Secondary | ICD-10-CM | POA: Diagnosis not present

## 2019-06-27 DIAGNOSIS — Z794 Long term (current) use of insulin: Secondary | ICD-10-CM | POA: Insufficient documentation

## 2019-06-27 DIAGNOSIS — N183 Chronic kidney disease, stage 3 unspecified: Secondary | ICD-10-CM | POA: Insufficient documentation

## 2019-06-27 DIAGNOSIS — G309 Alzheimer's disease, unspecified: Secondary | ICD-10-CM | POA: Diagnosis not present

## 2019-06-27 NOTE — Discharge Instructions (Addendum)
You were seen in the emergency department for evaluation of injuries after a fall.  You had no identifiable injuries.  You had no complaints.  Please follow-up with your doctor.  Return to the emergency department if any worsening symptoms or concerns.

## 2019-06-27 NOTE — ED Triage Notes (Signed)
Pt sent from [facility] by EMS to be evaluated status post fall this am.

## 2019-06-27 NOTE — ED Notes (Signed)
Ambulated pt in hallway with no difficulty, steady gait, no assistance required, O2 sats 95-97%, denies SOB, weakness and dizziness

## 2019-06-27 NOTE — ED Notes (Signed)
Call placed to [facility], spoke with Ophelia, advised pt is ready for discharge and asked if they would like pt transported back, she states by EMS, advised pt is ambulatory therefore the facility would be charged for the transport, she then states she will call pt's daughter to see if she will pick the pt up, if not, they would still like EMS transport despite the charge, will call back to advise

## 2019-06-27 NOTE — ED Notes (Signed)
Call back from [place] with [facility] requesting EMS transport back to facility

## 2019-06-27 NOTE — ED Provider Notes (Signed)
[facility] EMERGENCY DEPARTMENT Provider Note   CSN: [id number] Arrival date & time: 06/27/19  0631     History Chief Complaint  Patient presents with  . Fall    Jonathon Miller is a 86 y.o. male.  Level 5 caveat secondary to dementia.  86 year old male resident at [facility] brought in by EMS for evaluation after a fall this morning.  Patient denies any complaints.  Has no recollection of the fall.  The history is provided by the patient.  Fall This is a new problem. The current episode started 1 to 2 hours ago. The problem has been resolved. Pertinent negatives include no chest pain, no abdominal pain, no headaches and no shortness of breath. Nothing aggravates the symptoms. Nothing relieves the symptoms. He has tried nothing for the symptoms. The treatment provided no relief.       Past Medical History:  Diagnosis Date  . Alzheimer's dementia ([place])   . Anemia of chronic disease 03/18/2017  . Cancer (HCC)    Skin  . Cataract    skin  . CRD (chronic renal disease), stage III 03/18/2017  . Diabetes mellitus without complication ([place])   . Gastritis   . Hyperlipidemia   . Hypertension   . Myocardial infarction ([place])   . Thyroid disease     Patient Active Problem List   Diagnosis Date Noted  . Nodular basal cell carcinoma 03/30/2017  . Fatigue 03/30/2017  . Bradycardia 03/30/2017  . Proteinuria 03/18/2017  . CRD (chronic renal disease), stage III 03/18/2017  . Anemia of chronic disease 03/18/2017  . Type 2 diabetes mellitus with stage 3 chronic kidney disease, without long-term current use of insulin ([place]) 03/18/2017  . Alzheimer's dementia without behavioral disturbance ([place]) 03/17/2017  . Hypothyroidism 03/17/2017  . Hypertension 03/17/2017  . Hyperlipidemia 03/17/2017  . Gait disturbance 03/17/2017  . Falls frequently 03/17/2017    Past Surgical History:  Procedure Laterality Date  . APPENDECTOMY    . EYE SURGERY         Family History  Problem  Relation Age of Onset  . Diabetes Other     Social History   Tobacco Use  . Smoking status: Never Smoker  . Smokeless tobacco: Never Used  Substance Use Topics  . Alcohol use: No  . Drug use: No    Home Medications Prior to Admission medications   Medication Sig Start Date End Date Taking? Authorizing Provider  albuterol (PROVENTIL HFA;VENTOLIN HFA) 108 (90 Base) MCG/ACT inhaler Inhale 1 puff into the lungs every 6 (six) hours as needed for wheezing or shortness of breath. 07/12/17   [name], MD  aspirin EC 81 MG tablet Take 81 mg by mouth daily.    [provider]  citalopram (CELEXA) 20 MG tablet Take 1 tablet (20 mg total) by mouth daily. 06/10/17   [name], MD  famotidine (PEPCID) 20 MG tablet Take 1 tablet (20 mg total) by mouth 2 (two) times daily. Patient taking differently: Take 20 mg by mouth daily.  05/08/17   Francine Graven, DO  Insulin Detemir (LEVEMIR) 100 UNIT/ML Pen Inject 21 Units into the skin daily. Inject 21 units every  AM and 15 units at bedtime.    [provider]  levothyroxine (SYNTHROID) 50 MCG tablet Take 1 tablet (50 mcg total) by mouth daily before breakfast. 06/11/17   [name], MD  loratadine (CLARITIN) 10 MG tablet Take 1 tablet (10 mg total) by mouth daily. 06/11/17   [name], MD  nitroGLYCERIN (NITROSTAT) 0.4 MG SL tablet Place 0.4 mg under the tongue every 5 (five) minutes as needed for chest pain.    [provider]  pioglitazone (ACTOS) 15 MG tablet TAKE 1 TABLET BY MOUTH EVERY DAY 03/16/18   Cassandria Anger, MD  tamsulosin (FLOMAX) 0.4 MG CAPS capsule TAKE ONE CAPSULE BY MOUTH EVERY DAY FOR PROSTATE. 12/15/18   Doree Albee, MD  TRADJENTA 5 MG TABS tablet TAKE 1 TABLET BY MOUTH EVERY DAY 09/02/17   [name], MD  vitamin C (ASCORBIC ACID) 500 MG tablet Take 500 mg by mouth daily.    [provider]    Allergies    Penicillins  Review of Systems   Review of Systems    Unable to perform ROS: Dementia  Constitutional: Negative for fever.  Respiratory: Negative for shortness of breath.   Cardiovascular: Negative for chest pain.  Gastrointestinal: Negative for abdominal pain.  Musculoskeletal: Negative for neck pain.  Skin: Negative for wound.  Neurological: Negative for headaches.    Physical Exam Updated Vital Signs BP (!) 152/59   Pulse 72   Temp 98.2 F (36.8 C)   Resp 18   Ht 5\' 4"  (1.626 m)   Wt 66 kg   SpO2 95%   BMI 24.98 kg/m   Physical Exam Vitals and nursing note reviewed.  Constitutional:      Appearance: Normal appearance. He is well-developed.  HENT:     Head: Normocephalic and atraumatic.  Eyes:     Conjunctiva/sclera: Conjunctivae normal.  Cardiovascular:     Rate and Rhythm: Normal rate and regular rhythm.     Heart sounds: No murmur.  Pulmonary:     Effort: Pulmonary effort is normal. No respiratory distress.     Breath sounds: Normal breath sounds.  Abdominal:     Palpations: Abdomen is soft.     Tenderness: There is no abdominal tenderness.  Musculoskeletal:        General: No tenderness. Normal range of motion.     Cervical back: Neck supple.  Skin:    General: Skin is warm and dry.     Capillary Refill: Capillary refill takes less than 2 seconds.  Neurological:     General: No focal deficit present.     Mental Status: He is alert. Mental status is at baseline. He is disoriented.     Sensory: No sensory deficit.     Motor: No weakness.     Comments: Oriented to self.  Not oriented to time situation or place.  Moving all extremities without any limitations.     ED Results / Procedures / Treatments   Labs (all labs ordered are listed, but only abnormal results are displayed) Labs Reviewed - No data to display  EKG None  Radiology No results found.  Procedures Procedures (including critical care time)  Medications Ordered in ED Medications - No data to display  ED Course  I have reviewed the  triage vital signs and the nursing notes.  Pertinent labs & imaging results that were available during my care of the patient were reviewed by me and considered in my medical decision making (see chart for details).  Clinical Course as of Jun 26 1757  Tue Jun 27, 2019  [id number] Patient ambulated in the department without any pain.  No complaints.  Sats remained good.  Will discharge back to facility.   [MB]    Clinical Course User Index [MB] Hayden Rasmussen, MD   MDM Rules/Calculators/A&P  Differential includes contusion, fracture, bleed.  Patient has no signs of injury and has no complaints.  Ambulated safely in the department.  Will have return to facility and have them observe him.  Return instructions given to facility. Final Clinical Impression(s) / ED Diagnoses Final diagnoses:  Fall, initial encounter    Rx / DC Orders ED Discharge Orders    None       Hayden Rasmussen, MD 06/27/19 1759

## 2019-10-27 ENCOUNTER — Emergency Department (HOSPITAL_COMMUNITY)
Admission: EM | Admit: 2019-10-27 | Discharge: 2019-10-27 | Disposition: A | Discharge status: Skilled Nursing Facility | Payer: Medicare Other | Attending: Emergency Medicine | Admitting: Emergency Medicine

## 2019-10-27 DIAGNOSIS — E119 Type 2 diabetes mellitus without complications: Secondary | ICD-10-CM | POA: Diagnosis not present

## 2019-10-27 DIAGNOSIS — Z794 Long term (current) use of insulin: Secondary | ICD-10-CM | POA: Diagnosis not present

## 2019-10-27 DIAGNOSIS — G301 Alzheimer's disease with late onset: Secondary | ICD-10-CM

## 2019-10-27 DIAGNOSIS — I129 Hypertensive chronic kidney disease with stage 1 through stage 4 chronic kidney disease, or unspecified chronic kidney disease: Secondary | ICD-10-CM | POA: Insufficient documentation

## 2019-10-27 DIAGNOSIS — E039 Hypothyroidism, unspecified: Secondary | ICD-10-CM | POA: Insufficient documentation

## 2019-10-27 DIAGNOSIS — Z7982 Long term (current) use of aspirin: Secondary | ICD-10-CM | POA: Diagnosis not present

## 2019-10-27 DIAGNOSIS — Z79899 Other long term (current) drug therapy: Secondary | ICD-10-CM | POA: Diagnosis not present

## 2019-10-27 DIAGNOSIS — N183 Chronic kidney disease, stage 3 unspecified: Secondary | ICD-10-CM | POA: Diagnosis not present

## 2019-10-27 DIAGNOSIS — R4182 Altered mental status, unspecified: Secondary | ICD-10-CM | POA: Diagnosis present

## 2019-10-27 DIAGNOSIS — Z79890 Hormone replacement therapy (postmenopausal): Secondary | ICD-10-CM | POA: Insufficient documentation

## 2019-10-27 DIAGNOSIS — F0390 Unspecified dementia without behavioral disturbance: Secondary | ICD-10-CM | POA: Diagnosis not present

## 2019-10-27 LAB — CBC WITH DIFFERENTIAL/PLATELET
Abs Immature Granulocytes: 0.07 10*3/uL (ref 0.00–0.07)
Basophils Absolute: 0.1 10*3/uL (ref 0.0–0.1)
Basophils Relative: 1 %
Eosinophils Absolute: 0.2 10*3/uL (ref 0.0–0.5)
Eosinophils Relative: 3 %
HCT: 37 % — ABNORMAL LOW (ref 39.0–52.0)
Hemoglobin: 12.6 g/dL — ABNORMAL LOW (ref 13.0–17.0)
Immature Granulocytes: 1 %
Lymphocytes Relative: 16 %
Lymphs Abs: 1.4 10*3/uL (ref 0.7–4.0)
MCH: 31 pg (ref 26.0–34.0)
MCHC: 34.1 g/dL (ref 30.0–36.0)
MCV: 90.9 fL (ref 80.0–100.0)
Monocytes Absolute: 0.8 10*3/uL (ref 0.1–1.0)
Monocytes Relative: 9 %
Neutro Abs: 6.2 10*3/uL (ref 1.7–7.7)
Neutrophils Relative %: 70 %
Platelets: 257 10*3/uL (ref 150–400)
RBC: 4.07 MIL/uL — ABNORMAL LOW (ref 4.22–5.81)
RDW: 12.2 % (ref 11.5–15.5)
WBC: 8.8 10*3/uL (ref 4.0–10.5)
nRBC: 0 % (ref 0.0–0.2)

## 2019-10-27 LAB — BASIC METABOLIC PANEL
Anion gap: 11 (ref 5–15)
BUN: 23 mg/dL (ref 8–23)
CO2: 27 mmol/L (ref 22–32)
Calcium: 9.4 mg/dL (ref 8.9–10.3)
Chloride: 96 mmol/L — ABNORMAL LOW (ref 98–111)
Creatinine, Ser: 1.15 mg/dL (ref 0.61–1.24)
GFR calc Af Amer: >60 mL/min (ref >60)
GFR calc non Af Amer: 55 mL/min — ABNORMAL LOW (ref >60)
Glucose, Bld: 117 mg/dL — ABNORMAL HIGH (ref 70–99)
Potassium: 4.1 mmol/L (ref 3.5–5.1)
Sodium: 134 mmol/L — ABNORMAL LOW (ref 135–145)

## 2019-10-27 NOTE — Discharge Instructions (Addendum)
It was a pleasure to meet you today. You are at your baseline functioning and all labs are within normal limits. We recommend your nursing home ensure you have your hearing aids and glasses on a daily basis.   If you develop fevers, cough, chest pain, shortness of breath, nausea, vomiting, diarrhea, please see your primary care provider or be evaluated by nearest medical facility.

## 2019-10-27 NOTE — ED Provider Notes (Signed)
[facility] EMERGENCY DEPARTMENT Provider Note   CSN: [id number] Arrival date & time: 10/27/19  1405     History Chief Complaint  Patient presents with  . Altered Mental Status    Jonathon Miller is a 86 y.o. male.  [name] is a 86 yo gentleman presenting from a nursing home with concern for altered mental status.  This morning patient ate breakfast as usual at his nursing home, [facility], but when they the caregivers attempted to get him up for lunch, patient refused to get out of bed stating "I am a blind man."  He had his eyes closed, and was stating that he could not see and was blind.  This is unusual behavior for him.  Caregiver at [facility] reports that he had normal vital signs, normal BG, no fever, no specific complaints other than blindness, and has been in his usual state of health.  Spoke with patient's daughter, Jonathon Miller [phone], who reported the same story.  She said her father has hearing and visual difficulties at baseline and that the nursing home does not put in his hearing aids or glasses.  Indeed today in the ED he does not have his hearing aids or glasses.  Patient is alert and oriented x3, to person, place, but not time.  Daughter reports that this is better than baseline as he frequently is not oriented to place.  All parties deny any fever, cough, chest pain, shortness of breath, N/V/D, constipation, problems with urination, new rashes, pain.        Past Medical History:  Diagnosis Date  . Alzheimer's dementia ([place])   . Anemia of chronic disease 03/18/2017  . Cancer (HCC)    Skin  . Cataract    skin  . CRD (chronic renal disease), stage III 03/18/2017  . Diabetes mellitus without complication ([place])   . Gastritis   . Hyperlipidemia   . Hypertension   . Myocardial infarction ([place])   . Thyroid disease     Patient Active Problem List   Diagnosis Date Noted  . Nodular basal cell carcinoma 03/30/2017  . Fatigue 03/30/2017   . Bradycardia 03/30/2017  . Proteinuria 03/18/2017  . CRD (chronic renal disease), stage III ([place]) 03/18/2017  . Anemia of chronic disease 03/18/2017  . Type 2 diabetes mellitus with stage 3 chronic kidney disease, without long-term current use of insulin ([place]) 03/18/2017  . Alzheimer's dementia without behavioral disturbance ([place]) 03/17/2017  . Hypothyroidism 03/17/2017  . Hypertension 03/17/2017  . Hyperlipidemia 03/17/2017  . Gait disturbance 03/17/2017  . Falls frequently 03/17/2017    Past Surgical History:  Procedure Laterality Date  . APPENDECTOMY    . EYE SURGERY        Family History  Problem Relation Age of Onset  . Diabetes Other     Social History   Tobacco Use  . Smoking status: Never Smoker  . Smokeless tobacco: Never Used  Vaping Use  . Vaping Use: Never used  Substance Use Topics  . Alcohol use: No  . Drug use: No    Home Medications Prior to Admission medications   Medication Sig Start Date End Date Taking? Authorizing Provider  albuterol (PROVENTIL HFA;VENTOLIN HFA) 108 (90 Base) MCG/ACT inhaler Inhale 1 puff into the lungs every 6 (six) hours as needed for wheezing or shortness of breath. 07/12/17   [name], MD  aspirin EC 81 MG tablet Take 81 mg by mouth daily.    [provider]  citalopram (  CELEXA) 20 MG tablet Take 1 tablet (20 mg total) by mouth daily. 06/10/17   [name], MD  famotidine (PEPCID) 20 MG tablet Take 1 tablet (20 mg total) by mouth 2 (two) times daily. Patient taking differently: Take 20 mg by mouth daily.  05/08/17   Francine Graven, DO  Insulin Detemir (LEVEMIR) 100 UNIT/ML Pen Inject 21 Units into the skin daily. Inject 21 units every  AM and 15 units at bedtime.    [provider]  levothyroxine (SYNTHROID) 50 MCG tablet Take 1 tablet (50 mcg total) by mouth daily before breakfast. 06/11/17   [name], MD  loratadine (CLARITIN) 10 MG tablet Take 1 tablet (10 mg total) by mouth daily. 06/11/17    [name], MD  nitroGLYCERIN (NITROSTAT) 0.4 MG SL tablet Place 0.4 mg under the tongue every 5 (five) minutes as needed for chest pain.    [provider]  pioglitazone (ACTOS) 15 MG tablet TAKE 1 TABLET BY MOUTH EVERY DAY 03/16/18   Cassandria Anger, MD  tamsulosin (FLOMAX) 0.4 MG CAPS capsule TAKE ONE CAPSULE BY MOUTH EVERY DAY FOR PROSTATE. 12/15/18   Doree Albee, MD  TRADJENTA 5 MG TABS tablet TAKE 1 TABLET BY MOUTH EVERY DAY 09/02/17   [name], MD  vitamin C (ASCORBIC ACID) 500 MG tablet Take 500 mg by mouth daily.    [provider]    Allergies    Penicillins  Review of Systems   Review of Systems  Constitutional: Negative for activity change, chills and fatigue.  Respiratory: Negative for cough and shortness of breath.   Cardiovascular: Negative for chest pain.  Gastrointestinal: Negative for abdominal distention, abdominal pain, constipation, diarrhea, nausea and vomiting.  Genitourinary: Negative for difficulty urinating, dysuria and frequency.  Musculoskeletal: Negative for arthralgias.  Skin: Negative for rash.  Neurological: Negative for dizziness.  All other systems reviewed and are negative.   Physical Exam Updated Vital Signs BP (!) 154/55 (BP Location: Right Arm)   Pulse 65   Temp 98.4 F (36.9 C) (Oral)   Resp 15   Ht 5\' 4"  (1.626 m)   Wt 66 kg   SpO2 99%   BMI 24.98 kg/m   Physical Exam Constitutional:      General: He is not in acute distress.    Appearance: Normal appearance. He is normal weight. He is not ill-appearing or toxic-appearing.  HENT:     Head: Normocephalic and atraumatic.     Nose: No congestion or rhinorrhea.     Mouth/Throat:     Mouth: Mucous membranes are moist.     Pharynx: Oropharynx is clear.  Eyes:     Conjunctiva/sclera: Conjunctivae normal.     Pupils: Pupils are equal, round, and reactive to light.  Cardiovascular:     Rate and Rhythm: Normal rate and regular rhythm.     Pulses:  Normal pulses.     Heart sounds: Normal heart sounds. No murmur heard.  No friction rub. No gallop.   Pulmonary:     Effort: Pulmonary effort is normal. No respiratory distress.     Breath sounds: Normal breath sounds. No stridor. No wheezing, rhonchi or rales.  Abdominal:     General: Abdomen is flat. Bowel sounds are normal. There is no distension.     Palpations: Abdomen is soft.     Tenderness: There is no abdominal tenderness. There is no guarding.  Musculoskeletal:     Right lower leg: No edema.     Left  lower leg: No edema.  Lymphadenopathy:     Cervical: No cervical adenopathy.  Skin:    General: Skin is warm and dry.  Neurological:     General: No focal deficit present.     Mental Status: He is alert and oriented to person, place, and time. Mental status is at baseline.  Psychiatric:        Mood and Affect: Mood normal.        Behavior: Behavior normal.    ED Results / Procedures / Treatments   Labs (all labs ordered are listed, but only abnormal results are displayed) Labs Reviewed  BASIC METABOLIC PANEL - Abnormal; Notable for the following components:      Result Value   Sodium 134 (*)    Chloride 96 (*)    Glucose, Bld 117 (*)    GFR calc non Af Amer 55 (*)    All other components within normal limits  CBC WITH DIFFERENTIAL/PLATELET - Abnormal; Notable for the following components:   RBC 4.07 (*)    Hemoglobin 12.6 (*)    HCT 37.0 (*)    All other components within normal limits    EKG None  Radiology No results found.  Procedures Procedures (including critical care time)  Medications Ordered in ED Medications - No data to display  ED Course  I have reviewed the triage vital signs and the nursing notes.  Pertinent labs & imaging results that were available during my care of the patient were reviewed by me and considered in my medical decision making (see chart for details).  1603: patient better than his baseline at presentation to ED, does not  have hearing aids or glasses on and is quite hard of hearing. Will obtain BMP and CBC to evaluate electrolytes, BG, and hgb for anemia.   1719: BMP and CBC WNL, no concern for organic cause of altered mental status. Does not seem that patient had true altered mental status, but requires his hearing aids and eye glasses. VSS have remained stable, alert and oriented x3. Ok to return to nursing home.   MDM Rules/Calculators/A&P                           Final Clinical Impression(s) / ED Diagnoses Final diagnoses:  Late onset Alzheimer's dementia without behavioral disturbance [facility])    Rx / DC Orders ED Discharge Orders    None       Gladys Damme, MD 10/27/19 1723    Elnora Morrison, MD 10/28/19 [phone]

## 2019-10-27 NOTE — ED Triage Notes (Signed)
From [facility], per staff "not acting normal", altered mental status.

## 2020-03-30 ENCOUNTER — Emergency Department (HOSPITAL_COMMUNITY)

## 2020-03-30 ENCOUNTER — Emergency Department (HOSPITAL_COMMUNITY)
Admission: EM | Admit: 2020-03-30 | Discharge: 2020-03-30 | Disposition: A | Discharge status: Home / Self Care | Attending: Emergency Medicine | Admitting: Emergency Medicine

## 2020-03-30 ENCOUNTER — Encounter (HOSPITAL_COMMUNITY): Payer: Self-pay

## 2020-03-30 ENCOUNTER — Other Ambulatory Visit: Payer: Self-pay

## 2020-03-30 DIAGNOSIS — Z79899 Other long term (current) drug therapy: Secondary | ICD-10-CM | POA: Insufficient documentation

## 2020-03-30 DIAGNOSIS — Z85828 Personal history of other malignant neoplasm of skin: Secondary | ICD-10-CM | POA: Insufficient documentation

## 2020-03-30 DIAGNOSIS — E785 Hyperlipidemia, unspecified: Secondary | ICD-10-CM | POA: Diagnosis not present

## 2020-03-30 DIAGNOSIS — R55 Syncope and collapse: Secondary | ICD-10-CM | POA: Insufficient documentation

## 2020-03-30 DIAGNOSIS — I129 Hypertensive chronic kidney disease with stage 1 through stage 4 chronic kidney disease, or unspecified chronic kidney disease: Secondary | ICD-10-CM | POA: Insufficient documentation

## 2020-03-30 DIAGNOSIS — Z66 Do not resuscitate: Secondary | ICD-10-CM | POA: Insufficient documentation

## 2020-03-30 DIAGNOSIS — G309 Alzheimer's disease, unspecified: Secondary | ICD-10-CM | POA: Insufficient documentation

## 2020-03-30 DIAGNOSIS — Z7984 Long term (current) use of oral hypoglycemic drugs: Secondary | ICD-10-CM | POA: Insufficient documentation

## 2020-03-30 DIAGNOSIS — F0281 Dementia in other diseases classified elsewhere with behavioral disturbance: Secondary | ICD-10-CM | POA: Diagnosis not present

## 2020-03-30 DIAGNOSIS — Z794 Long term (current) use of insulin: Secondary | ICD-10-CM | POA: Diagnosis not present

## 2020-03-30 DIAGNOSIS — E039 Hypothyroidism, unspecified: Secondary | ICD-10-CM | POA: Diagnosis not present

## 2020-03-30 DIAGNOSIS — Z7982 Long term (current) use of aspirin: Secondary | ICD-10-CM | POA: Diagnosis not present

## 2020-03-30 DIAGNOSIS — N183 Chronic kidney disease, stage 3 unspecified: Secondary | ICD-10-CM | POA: Insufficient documentation

## 2020-03-30 DIAGNOSIS — E1169 Type 2 diabetes mellitus with other specified complication: Secondary | ICD-10-CM | POA: Diagnosis not present

## 2020-03-30 LAB — COMPREHENSIVE METABOLIC PANEL
ALT: 14 U/L (ref 0–44)
AST: 19 U/L (ref 15–41)
Albumin: 3.8 g/dL (ref 3.5–5.0)
Alkaline Phosphatase: 58 U/L (ref 38–126)
Anion gap: 13 (ref 5–15)
BUN: 19 mg/dL (ref 8–23)
CO2: 26 mmol/L (ref 22–32)
Calcium: 9.2 mg/dL (ref 8.9–10.3)
Chloride: 94 mmol/L — ABNORMAL LOW (ref 98–111)
Creatinine, Ser: 1.27 mg/dL — ABNORMAL HIGH (ref 0.61–1.24)
GFR, Estimated: 53 mL/min — ABNORMAL LOW (ref >60)
Glucose, Bld: 173 mg/dL — ABNORMAL HIGH (ref 70–99)
Potassium: 3.9 mmol/L (ref 3.5–5.1)
Sodium: 133 mmol/L — ABNORMAL LOW (ref 135–145)
Total Bilirubin: 0.8 mg/dL (ref 0.3–1.2)
Total Protein: 7.1 g/dL (ref 6.5–8.1)

## 2020-03-30 LAB — TROPONIN I (HIGH SENSITIVITY)
Troponin I (High Sensitivity): 12 ng/L (ref <18)
Troponin I (High Sensitivity): 11 ng/L (ref <18)

## 2020-03-30 LAB — CBC
HCT: 38.1 % — ABNORMAL LOW (ref 39.0–52.0)
Hemoglobin: 12.5 g/dL — ABNORMAL LOW (ref 13.0–17.0)
MCH: 30.6 pg (ref 26.0–34.0)
MCHC: 32.8 g/dL (ref 30.0–36.0)
MCV: 93.2 fL (ref 80.0–100.0)
Platelets: 287 10*3/uL (ref 150–400)
RBC: 4.09 MIL/uL — ABNORMAL LOW (ref 4.22–5.81)
RDW: 13 % (ref 11.5–15.5)
WBC: 8.8 10*3/uL (ref 4.0–10.5)
nRBC: 0 % (ref 0.0–0.2)

## 2020-03-30 LAB — BLOOD GAS, ARTERIAL
Acid-Base Excess: 4.3 mmol/L — ABNORMAL HIGH (ref 0.0–2.0)
Bicarbonate: 28.4 mmol/L — ABNORMAL HIGH (ref 20.0–28.0)
FIO2: 21
O2 Saturation: 99.5 %
Patient temperature: 36.4
pCO2 arterial: 36.7 mmHg (ref 32.0–48.0)
pH, Arterial: 7.488 — ABNORMAL HIGH (ref 7.350–7.450)
pO2, Arterial: 139 mmHg — ABNORMAL HIGH (ref 83.0–108.0)

## 2020-03-30 NOTE — ED Provider Notes (Signed)
[facility] EMERGENCY DEPARTMENT Provider Note   CSN: [id number] Arrival date & time: 03/30/20  1126     History Chief Complaint  Patient presents with  . Loss of Consciousness    Jonathon Miller is a 87 y.o. male.  HPI  Level 5 caveat secondary to dementia History obtained from EMS and called to [place] [facility] for patient's resident Discussed with 87 year old male transported via EMS with reports that he had an episode of altered mental status required sternal rub.  They report his CBG was 770.  They report the patient was combative and has a history of dementia.  Discussed with [facility] called caregiver to room they thought patient had stopped breathing.  When med tech arrived seemed to breathing but not responding.  No CPR done. Patient came around after about 5 minutes of calling to hm, had pulse. Patient can walk but has been in wheelchair.Today he was in wheelchair but was moved to ground. Discussed with son-in-law when he arrived.  Also discussed with daughter via phone.  They have decided they want the patient to be DNR and comfort care.  Past Medical History:  Diagnosis Date  . Alzheimer's dementia ([place])   . Anemia of chronic disease 03/18/2017  . Cancer (HCC)    Skin  . Cataract    skin  . CRD (chronic renal disease), stage III ([place]) 03/18/2017  . Diabetes mellitus without complication ([place])   . Gastritis   . Hyperlipidemia   . Hypertension   . Myocardial infarction ([place])   . Thyroid disease     Patient Active Problem List   Diagnosis Date Noted  . Nodular basal cell carcinoma 03/30/2017  . Fatigue 03/30/2017  . Bradycardia 03/30/2017  . Proteinuria 03/18/2017  . CRD (chronic renal disease), stage III ([place]) 03/18/2017  . Anemia of chronic disease 03/18/2017  . Type 2 diabetes mellitus with stage 3 chronic kidney disease, without long-term current use of insulin ([place]) 03/18/2017  . Alzheimer's dementia without behavioral disturbance ([place]) 03/17/2017  .  Hypothyroidism 03/17/2017  . Hypertension 03/17/2017  . Hyperlipidemia 03/17/2017  . Gait disturbance 03/17/2017  . Falls frequently 03/17/2017    Past Surgical History:  Procedure Laterality Date  . APPENDECTOMY    . EYE SURGERY         Family History  Problem Relation Age of Onset  . Diabetes Other     Social History   Tobacco Use  . Smoking status: Never Smoker  . Smokeless tobacco: Never Used  Vaping Use  . Vaping Use: Never used  Substance Use Topics  . Alcohol use: No  . Drug use: No    Home Medications Prior to Admission medications   Medication Sig Start Date End Date Taking? Authorizing Provider  albuterol (PROVENTIL HFA;VENTOLIN HFA) 108 (90 Base) MCG/ACT inhaler Inhale 1 puff into the lungs every 6 (six) hours as needed for wheezing or shortness of breath. 07/12/17   [name], MD  aspirin EC 81 MG tablet Take 81 mg by mouth daily.    [provider]  citalopram (CELEXA) 20 MG tablet Take 1 tablet (20 mg total) by mouth daily. 06/10/17   [name], MD  famotidine (PEPCID) 20 MG tablet Take 1 tablet (20 mg total) by mouth 2 (two) times daily. Patient taking differently: Take 20 mg by mouth daily.  05/08/17   Francine Graven, DO  Insulin Detemir (LEVEMIR) 100 UNIT/ML Pen Inject 21 Units into the skin daily. Inject 21 units every  AM  and 15 units at bedtime.    [provider]  levothyroxine (SYNTHROID) 50 MCG tablet Take 1 tablet (50 mcg total) by mouth daily before breakfast. 06/11/17   [name], MD  loratadine (CLARITIN) 10 MG tablet Take 1 tablet (10 mg total) by mouth daily. 06/11/17   [name], MD  nitroGLYCERIN (NITROSTAT) 0.4 MG SL tablet Place 0.4 mg under the tongue every 5 (five) minutes as needed for chest pain.    [provider]  pioglitazone (ACTOS) 15 MG tablet TAKE 1 TABLET BY MOUTH EVERY DAY 03/16/18   Cassandria Anger, MD  tamsulosin (FLOMAX) 0.4 MG CAPS capsule TAKE ONE CAPSULE BY MOUTH EVERY  DAY FOR PROSTATE. 12/15/18   Doree Albee, MD  TRADJENTA 5 MG TABS tablet TAKE 1 TABLET BY MOUTH EVERY DAY 09/02/17   [name], MD  vitamin C (ASCORBIC ACID) 500 MG tablet Take 500 mg by mouth daily.    [provider]    Allergies    Penicillins  Review of Systems   Review of Systems  Unable to perform ROS: Dementia    Physical Exam Updated Vital Signs BP (!) 134/117 (BP Location: Right Arm)   Pulse 90   Resp 18   Ht 1.651 m (5\' 5" )   Wt 72.6 kg   SpO2 90%   BMI 26.63 kg/m   Physical Exam Vitals and nursing note reviewed.  HENT:     Head: Normocephalic.     Right Ear: External ear normal.     Left Ear: External ear normal.     Nose: Nose normal.     Mouth/Throat:     Mouth: Mucous membranes are moist.  Eyes:     Extraocular Movements: Extraocular movements intact.  Cardiovascular:     Rate and Rhythm: Normal rate.  Pulmonary:     Effort: Pulmonary effort is normal.  Abdominal:     Palpations: Abdomen is soft.  Genitourinary:    Penis: Normal.      Rectum: Normal.  Musculoskeletal:        General: No swelling or tenderness. Normal range of motion.     Cervical back: Normal range of motion.  Skin:    General: Skin is warm.     Capillary Refill: Capillary refill takes less than 2 seconds.  Neurological:     Mental Status: He is alert.     Comments: Patient awake and moving all extremities Not speaking directly to [place] but does verbalize 'What's that?  And other questions     ED Results / Procedures / Treatments   Labs (all labs ordered are listed, but only abnormal results are displayed) Labs Reviewed - No data to display  EKG EKG Interpretation  Date/Time:  Saturday March 30 2020 13:07:37 EST Ventricular Rate:  97 PR Interval:    QRS Duration: 109 QT Interval:  343 QTC Calculation: 434 R Axis:   80 Text Interpretation: Sinus rhythm Atrial premature complex Probable left atrial enlargement Anteroseptal infarct, age indeterminate  Lateral leads are also involved Confirmed by Pattricia Boss [phone]) on 03/30/2020 2:39:52 PM   Radiology CT Head Wo Contrast  Result Date: 03/30/2020 CLINICAL DATA:  87 year old male with altered mental status and syncope. EXAM: CT HEAD WITHOUT CONTRAST TECHNIQUE: Contiguous axial images were obtained from the base of the skull through the vertex without intravenous contrast. COMPARISON:  11/24/2018 CT and 06/17/2017 MR FINDINGS: Brain: A moderate-sized acute to subacute posterior RIGHT parietal/RIGHT occipital infarct is noted with possible petechial hemorrhage.  Atrophy, chronic small-vessel white matter ischemic changes and remote basal ganglia infarcts are again noted. No midline shift or hydrocephalus identified. Vascular: Carotid and vertebral atherosclerotic calcifications are noted. Skull: No acute abnormality Sinuses/Orbits: No acute abnormality Other: None IMPRESSION: 1. Moderate acute to subacute posterior RIGHT parietal/RIGHT occipital infarct with possible petechial hemorrhage. No midline shift or hydrocephalus. 2. Atrophy, chronic small-vessel white matter ischemic changes and remote basal ganglia infarcts. Electronically Signed   By: Margarette Canada M.D.   On: 03/30/2020 12:51   DG Chest Port 1 View  Result Date: 03/30/2020 CLINICAL DATA:  Altered mental status. EXAM: PORTABLE CHEST 1 VIEW COMPARISON:  PA and lateral chest 05/28/2017. FINDINGS: Lung volumes are low with some crowding of the bronchovascular structures. No consolidative process, pneumothorax or effusion. Heart size is normal. No acute or focal bony abnormality. IMPRESSION: No acute disease. Electronically Signed   By: Inge Rise M.D.   On: 03/30/2020 13:45    Procedures .Critical Care Performed by: Pattricia Boss, MD Authorized by: Pattricia Boss, MD   Critical care provider statement:    Critical care time (minutes):  45   Critical care end time:  03/30/2020 2:44 PM   Critical care was time spent personally by me on the  following activities:  Discussions with consultants, evaluation of patient's response to treatment, examination of patient, ordering and performing treatments and interventions, ordering and review of laboratory studies, ordering and review of radiographic studies, pulse oximetry, re-evaluation of patient's condition, obtaining history from patient or surrogate and review of old charts     Medications Ordered in ED Medications - No data to display  ED Course  I have reviewed the triage vital signs and the nursing notes.  Pertinent labs & imaging results that were available during my care of the patient were reviewed by me and considered in my medical decision making (see chart for details).    MDM Rules/Calculators/A&P                         Son-in-law at bedside.  He states that the patient's power of attorney, his wife Juliann Pulse wishes to make him DNR.  Labs reviewed. Patient stable here. Plan d/c back to facility. Discussed with  Leafy Ro, at hospice and will make facility aware of change in status. We do not have gold most forms in ED Final Clinical Impression(s) / ED Diagnoses Final diagnoses:  Syncope and collapse  DNR (do not resuscitate)    Rx / DC Orders ED Discharge Orders    None       Pattricia Boss, MD 03/30/20 1444

## 2020-03-30 NOTE — ED Notes (Signed)
Pt arrived combative, attempted to place on monitor and pt began to become more combative.  Skin warm and dry.  Pt arrouseable with stimulation.  Bed exit in place.

## 2020-03-30 NOTE — Discharge Instructions (Addendum)
Patient appears stable healing here in emergency department Discussed with Leafy Ro at hospice and daughter Juliann Pulse.  Patient status has been changed to DO NOT RESUSCITATE and comfort care.  Leafy Ro will contact you with the correct forms

## 2020-03-30 NOTE — ED Triage Notes (Signed)
Pt from [facility], brought to ED via [place] for syncopal episode where pt was out for 5 min, would come around with sternal rub, CBG 170. Pt combative. Pt with hx dementia

## 2020-11-16 ENCOUNTER — Observation Stay (HOSPITAL_COMMUNITY)
Admission: EM | Admit: 2020-11-16 | Discharge: 2020-11-17 | Disposition: A | Discharge status: Hospice | Attending: Internal Medicine | Admitting: Internal Medicine

## 2020-11-16 ENCOUNTER — Emergency Department (HOSPITAL_COMMUNITY)

## 2020-11-16 ENCOUNTER — Other Ambulatory Visit: Payer: Self-pay

## 2020-11-16 ENCOUNTER — Encounter (HOSPITAL_COMMUNITY): Payer: Self-pay | Admitting: Emergency Medicine

## 2020-11-16 DIAGNOSIS — Z85828 Personal history of other malignant neoplasm of skin: Secondary | ICD-10-CM | POA: Insufficient documentation

## 2020-11-16 DIAGNOSIS — W1839XA Other fall on same level, initial encounter: Secondary | ICD-10-CM | POA: Insufficient documentation

## 2020-11-16 DIAGNOSIS — W19XXXA Unspecified fall, initial encounter: Secondary | ICD-10-CM

## 2020-11-16 DIAGNOSIS — E039 Hypothyroidism, unspecified: Secondary | ICD-10-CM | POA: Insufficient documentation

## 2020-11-16 DIAGNOSIS — Z794 Long term (current) use of insulin: Secondary | ICD-10-CM | POA: Insufficient documentation

## 2020-11-16 DIAGNOSIS — F0280 Dementia in other diseases classified elsewhere without behavioral disturbance: Secondary | ICD-10-CM | POA: Diagnosis present

## 2020-11-16 DIAGNOSIS — Z20822 Contact with and (suspected) exposure to covid-19: Secondary | ICD-10-CM | POA: Insufficient documentation

## 2020-11-16 DIAGNOSIS — R269 Unspecified abnormalities of gait and mobility: Secondary | ICD-10-CM

## 2020-11-16 DIAGNOSIS — R296 Repeated falls: Secondary | ICD-10-CM

## 2020-11-16 DIAGNOSIS — Z79899 Other long term (current) drug therapy: Secondary | ICD-10-CM | POA: Diagnosis not present

## 2020-11-16 DIAGNOSIS — N183 Chronic kidney disease, stage 3 unspecified: Secondary | ICD-10-CM | POA: Diagnosis not present

## 2020-11-16 DIAGNOSIS — S72111A Displaced fracture of greater trochanter of right femur, initial encounter for closed fracture: Secondary | ICD-10-CM | POA: Diagnosis not present

## 2020-11-16 DIAGNOSIS — M25559 Pain in unspecified hip: Secondary | ICD-10-CM

## 2020-11-16 DIAGNOSIS — Z7982 Long term (current) use of aspirin: Secondary | ICD-10-CM | POA: Diagnosis not present

## 2020-11-16 DIAGNOSIS — I129 Hypertensive chronic kidney disease with stage 1 through stage 4 chronic kidney disease, or unspecified chronic kidney disease: Secondary | ICD-10-CM | POA: Diagnosis not present

## 2020-11-16 DIAGNOSIS — E1122 Type 2 diabetes mellitus with diabetic chronic kidney disease: Secondary | ICD-10-CM | POA: Diagnosis not present

## 2020-11-16 DIAGNOSIS — G309 Alzheimer's disease, unspecified: Secondary | ICD-10-CM | POA: Diagnosis not present

## 2020-11-16 DIAGNOSIS — S79911A Unspecified injury of right hip, initial encounter: Secondary | ICD-10-CM | POA: Diagnosis present

## 2020-11-16 DIAGNOSIS — S72001A Fracture of unspecified part of neck of right femur, initial encounter for closed fracture: Secondary | ICD-10-CM

## 2020-11-16 LAB — PROTIME-INR
INR: 1 (ref 0.8–1.2)
Prothrombin Time: 12.8 seconds (ref 11.4–15.2)

## 2020-11-16 LAB — BASIC METABOLIC PANEL
Anion gap: 8 (ref 5–15)
BUN: 20 mg/dL (ref 8–23)
CO2: 31 mmol/L (ref 22–32)
Calcium: 8.8 mg/dL — ABNORMAL LOW (ref 8.9–10.3)
Chloride: 98 mmol/L (ref 98–111)
Creatinine, Ser: 1.01 mg/dL (ref 0.61–1.24)
GFR, Estimated: >60 mL/min (ref >60)
Glucose, Bld: 168 mg/dL — ABNORMAL HIGH (ref 70–99)
Potassium: 4.6 mmol/L (ref 3.5–5.1)
Sodium: 137 mmol/L (ref 135–145)

## 2020-11-16 LAB — CBC WITH DIFFERENTIAL/PLATELET
Abs Immature Granulocytes: 0.11 10*3/uL — ABNORMAL HIGH (ref 0.00–0.07)
Basophils Absolute: 0.1 10*3/uL (ref 0.0–0.1)
Basophils Relative: 1 %
Eosinophils Absolute: 0.1 10*3/uL (ref 0.0–0.5)
Eosinophils Relative: 1 %
HCT: 28 % — ABNORMAL LOW (ref 39.0–52.0)
Hemoglobin: 9 g/dL — ABNORMAL LOW (ref 13.0–17.0)
Immature Granulocytes: 1 %
Lymphocytes Relative: 6 %
Lymphs Abs: 0.7 10*3/uL (ref 0.7–4.0)
MCH: 30.1 pg (ref 26.0–34.0)
MCHC: 32.1 g/dL (ref 30.0–36.0)
MCV: 93.6 fL (ref 80.0–100.0)
Monocytes Absolute: 0.9 10*3/uL (ref 0.1–1.0)
Monocytes Relative: 8 %
Neutro Abs: 8.8 10*3/uL — ABNORMAL HIGH (ref 1.7–7.7)
Neutrophils Relative %: 83 %
Platelets: 284 10*3/uL (ref 150–400)
RBC: 2.99 MIL/uL — ABNORMAL LOW (ref 4.22–5.81)
RDW: 13.8 % (ref 11.5–15.5)
WBC: 10.6 10*3/uL — ABNORMAL HIGH (ref 4.0–10.5)
nRBC: 0 % (ref 0.0–0.2)

## 2020-11-16 LAB — ABO/RH: ABO/RH(D): O POS

## 2020-11-16 LAB — TYPE AND SCREEN
ABO/RH(D): O POS
Antibody Screen: NEGATIVE

## 2020-11-16 LAB — RESP PANEL BY RT-PCR (FLU A&B, COVID) ARPGX2
Influenza A by PCR: NEGATIVE
Influenza B by PCR: NEGATIVE
SARS Coronavirus 2 by RT PCR: NEGATIVE

## 2020-11-16 LAB — CBG MONITORING, ED: Glucose-Capillary: 132 mg/dL — ABNORMAL HIGH (ref 70–99)

## 2020-11-16 MED ORDER — LORAZEPAM 0.5 MG PO TABS: 0.5000 mg | ORAL_TABLET | Freq: Four times a day (QID) | ORAL | 0 refills | Status: AC | PRN

## 2020-11-16 MED ORDER — ONDANSETRON HCL 4 MG/2ML IJ SOLN: 4.0000 mg | Freq: Four times a day (QID) | INTRAMUSCULAR | Status: DC | PRN

## 2020-11-16 MED ORDER — MORPHINE SULFATE (CONCENTRATE) 20 MG/ML PO SOLN: 10.0000 mg | ORAL | 0 refills | Status: AC | PRN

## 2020-11-16 MED ORDER — OXYCODONE HCL 5 MG PO TABS: 5.0000 mg | ORAL_TABLET | ORAL | 0 refills | Status: AC | PRN

## 2020-11-16 MED ORDER — MORPHINE SULFATE (CONCENTRATE) 20 MG/ML PO SOLN: 10.0000 mg | ORAL | 0 refills | Status: DC | PRN

## 2020-11-16 MED ORDER — OXYCODONE HCL 5 MG PO TABS: 5.0000 mg | ORAL_TABLET | ORAL | 0 refills | Status: DC | PRN

## 2020-11-16 MED ORDER — FENTANYL CITRATE PF 50 MCG/ML IJ SOSY: 50.0000 ug | PREFILLED_SYRINGE | INTRAMUSCULAR | Status: DC | PRN

## 2020-11-16 NOTE — Discharge Instructions (Addendum)
1)Hospice Team to continue to have manage pain and keep patient comfortable at [facility]--- if pain control becomes more challenging hospice will arrange for patient to be transferred to a residential hospice facility for better pain control/pain management  2)The right greater trochanter fracture is non-operative as per orthopedic surgeon----given recurrent falls, safety protocol to discourage ambulation and prevent falls  encouraged

## 2020-11-16 NOTE — ED Provider Notes (Signed)
[facility] EMERGENCY DEPARTMENT Provider Note   CSN: [id number] Arrival date & time: 11/16/20  0148     History Chief Complaint  Patient presents with   Fall   Level 5 caveat due to dementia Dryden Tapley is a 87 y.o. male.  The history is provided by the patient.  Fall This is a new problem. Nothing aggravates the symptoms. Nothing relieves the symptoms.  Patient presents for mechanical fall from nursing facility.  Patient presents from local memory care unit.  Patient apparently was getting out of the wheelchair and fell down.  Patient denies any pain, but was sent for further evaluation Patient had recent falls and sustained abrasions to his left arm but these are nonacute    Past Medical History:  Diagnosis Date   Alzheimer's dementia ([place])    Anemia of chronic disease 03/18/2017   Cancer ([place])    Skin   Cataract    skin   CRD (chronic renal disease), stage III ([place]) 03/18/2017   Diabetes mellitus without complication ([place])    Gastritis    Hyperlipidemia    Hypertension    Myocardial infarction [facility])    Thyroid disease     Patient Active Problem List   Diagnosis Date Noted   Nodular basal cell carcinoma 03/30/2017   Fatigue 03/30/2017   Bradycardia 03/30/2017   Proteinuria 03/18/2017   CRD (chronic renal disease), stage III ([place]) 03/18/2017   Anemia of chronic disease 03/18/2017   Type 2 diabetes mellitus with stage 3 chronic kidney disease, without long-term current use of insulin ([place]) 03/18/2017   Alzheimer's dementia without behavioral disturbance ([place]) 03/17/2017   Hypothyroidism 03/17/2017   Hypertension 03/17/2017   Hyperlipidemia 03/17/2017   Gait disturbance 03/17/2017   Falls frequently 03/17/2017    Past Surgical History:  Procedure Laterality Date   APPENDECTOMY     EYE SURGERY         Family History  Problem Relation Age of Onset   Diabetes Other     Social History   Tobacco Use   Smoking status: Never   Smokeless tobacco:  Never  Vaping Use   Vaping Use: Never used  Substance Use Topics   Alcohol use: No   Drug use: No    Home Medications Prior to Admission medications   Medication Sig Start Date End Date Taking? Authorizing Provider  acetaminophen (TYLENOL) 325 MG tablet Take 650 mg by mouth in the morning, at noon, and at bedtime.    [provider]  albuterol (PROVENTIL HFA;VENTOLIN HFA) 108 (90 Base) MCG/ACT inhaler Inhale 1 puff into the lungs every 6 (six) hours as needed for wheezing or shortness of breath. Patient not taking: Reported on 03/30/2020 07/12/17   [name], MD  amLODipine (NORVASC) 2.5 MG tablet Take 2.5 mg by mouth daily. 03/28/20   [provider]  aspirin EC 81 MG tablet Take 81 mg by mouth daily.    [provider]  citalopram (CELEXA) 20 MG tablet Take 1 tablet (20 mg total) by mouth daily. 06/10/17   [name], MD  DEBROX 6.5 % OTIC solution Place 10 drops into both ears once a week. Mondays. 02/20/20   [provider]  DEEP SEA NASAL SPRAY 0.65 % nasal spray Place 1 spray into the nose 3 (three) times daily as needed for irritation. 03/01/20   [provider]  divalproex (DEPAKOTE) 125 MG DR tablet Take 125 mg by mouth 2 (two) times daily. 03/13/20   [provider]  famotidine (PEPCID) 20 MG tablet Take 1 tablet (20 mg total) by mouth 2 (two) times daily. Patient taking differently: Take 20 mg by mouth daily. 05/08/17   Francine Graven, DO  Insulin Detemir (LEVEMIR) 100 UNIT/ML Pen Inject 12 Units into the skin at bedtime.    [provider]  levofloxacin (LEVAQUIN) 500 MG tablet Take 500 mg by mouth daily. Starting 03/22/20 x 7 days. 03/22/20   [provider]  levothyroxine (SYNTHROID) 50 MCG tablet Take 1 tablet (50 mcg total) by mouth daily before breakfast. 06/11/17   [name], MD  loperamide (IMODIUM) 2 MG capsule Take 2 mg by mouth as needed for diarrhea or loose stools. No more than 8 doses in a 24  hour period.    [provider]  loratadine (CLARITIN) 10 MG tablet Take 1 tablet (10 mg total) by mouth daily. Patient not taking: Reported on 03/30/2020 06/11/17   [name], MD  Lorazepam POWD Apply 1 application topically every 4 (four) hours. Compounded into a gel. Apply 1 ml (0.5 mg) to wrist every 4 hours (Scheduled). 03/22/20   [provider]  melatonin 3 MG TABS tablet Take 1 tablet by mouth at bedtime as needed for sleep. 03/05/20   [provider]  Menthol-Methyl Salicylate (MUSCLE RUB) 10-15 % CREA Apply 1 application topically at bedtime as needed for pain. 03/19/20   [provider]  nitroGLYCERIN (NITROSTAT) 0.4 MG SL tablet Place 0.4 mg under the tongue every 5 (five) minutes as needed for chest pain. Patient not taking: Reported on 03/30/2020    [provider]  ondansetron (ZOFRAN) 4 MG tablet Take 4 mg by mouth every 4 (four) hours as needed for nausea/vomiting. 03/28/20   [provider]  pioglitazone (ACTOS) 15 MG tablet TAKE 1 TABLET BY MOUTH EVERY DAY Patient not taking: Reported on 03/30/2020 03/16/18   Cassandria Anger, MD  QUEtiapine (SEROQUEL) 25 MG tablet Take 1 tablet by mouth in the morning and at bedtime. 03/18/20   [provider]  tamsulosin (FLOMAX) 0.4 MG CAPS capsule TAKE ONE CAPSULE BY MOUTH EVERY DAY FOR PROSTATE. Patient taking differently: Take 0.4 mg by mouth daily. 12/15/18   Gosrani, Nimish C, MD  TRADJENTA 5 MG TABS tablet TAKE 1 TABLET BY MOUTH EVERY DAY Patient taking differently: Take 5 mg by mouth daily. 09/02/17   [name], MD    Allergies    Penicillins  Review of Systems   Review of Systems  Unable to perform ROS: Dementia   Physical Exam Updated Vital Signs BP (!) 149/91 (BP Location: Left Arm)   Pulse 92   Temp 98.5 F (36.9 C) (Oral)   Resp 19   Ht 1.651 m (5\' 5" )   Wt 73 kg   SpO2 100%   BMI 26.78 kg/m   Physical Exam CONSTITUTIONAL: elderly and frail HEAD:  Normocephalic/atraumatic, no signs of trauma EYES: EOMI/PERRL ENMT: Mucous membranes moist NECK: supple no meningeal signs SPINE/BACK:entire spine nontender Chest - no tenderness or bruising ABDOMEN: soft, nontender, no bruising NEURO: Pt is awake/alert, patient is pleasantly confused moves all extremitiesx4.  No facial droop.   EXTREMITIES: pulses normal/equal, full ROM Pelvis appears stable.  No significant tenderness of range of motion of either hip Scattered abrasions noted to left forearm and left hand that are nonacute.  No deformities to left arm All other extremities/joints palpated/ranged and nontender SKIN: warm, color normal   ED Results / Procedures / Treatments   Labs (all labs ordered  are listed, but only abnormal results are displayed) Labs Reviewed  CBC WITH DIFFERENTIAL/PLATELET - Abnormal; Notable for the following components:      Result Value   WBC 10.6 (*)    RBC 2.99 (*)    Hemoglobin 9.0 (*)    HCT 28.0 (*)    Neutro Abs 8.8 (*)    Abs Immature Granulocytes 0.11 (*)    All other components within normal limits  RESP PANEL BY RT-PCR (FLU A&B, COVID) ARPGX2  PROTIME-INR  BASIC METABOLIC PANEL  TYPE AND SCREEN    EKG ED ECG REPORT   Date: 11/16/2020 060am  Rate: 84  Rhythm: normal sinus rhythm  QRS Axis: normal  Intervals: normal  ST/T Wave abnormalities: nonspecific ST changes  Conduction Disutrbances:none    I have personally reviewed the EKG tracing and disagree with the computerized printout as noted.  Radiology DG Pelvis Portable  Result Date: 11/16/2020 CLINICAL DATA:  Status post fall. EXAM: PORTABLE PELVIS 1-2 VIEWS COMPARISON:  None. FINDINGS: A linear lucency is seen extending across the inter trochanteric region of the proximal right femur. There is no evidence of dislocation. Degenerative changes are seen involving the bilateral hips and visualized portion of the lower lumbar spine. IMPRESSION: Findings which may represent a  nondisplaced inter trochanteric fracture of the proximal right femur. CT correlation is recommended. Electronically Signed   By: Virgina Norfolk M.D.   On: 11/16/2020 04:09   CT Hip Right Wo Contrast  Result Date: 11/16/2020 CLINICAL DATA:  Status post fall EXAM: CT OF THE RIGHT HIP WITHOUT CONTRAST TECHNIQUE: Multidetector CT imaging of the right hip was performed according to the standard protocol. Multiplanar CT image reconstructions were also generated. COMPARISON:  None. FINDINGS: Bones/Joint/Cartilage There is an acute, mildly comminuted fracture deformity involving the greater trochanter of the proximal right femur. The fracture fragments are displaced by approximately 7 mm. Fracture line does not extend through the intertrochanteric portions of the proximal right femur. Lesser trochanter appears intact. Right hip appears located. Ligaments Suboptimally assessed by CT. Muscles and Tendons There is mild edema and soft tissue stranding involving the muscles overlying the greater trochanter. Soft tissues Subcutaneous soft tissue stranding is noted overlying the greater trochanter of the proximal right femur. IMPRESSION: Acute, mildly comminuted fracture deformity involving the greater trochanter of the proximal right femur. Electronically Signed   By: Kerby Moors M.D.   On: 11/16/2020 05:23    Procedures Procedures   Medications Ordered in ED Medications - No data to display  ED Course  I have reviewed the triage vital signs and the nursing notes.  Pertinent imaging results that were available during my care of the patient were reviewed by me and considered in my medical decision making (see chart for details).    MDM Rules/Calculators/A&P                           Patient with history of dementia presents after accidental fall at the memory care unit. Patient had no evidence of any head injury.  He appeared comfortable in the bed.  There was report of right hip pain.  Imaging  confirms right intertrochanteric fracture  Per nursing home, patient is typically wheelchair and bedbound. I spoke to his daughter Lorenza Evangelist at [phone] She reports that he is on hospice and does not ambulate. 6:53 AM D/w Dr Amedeo Kinsman with ortho We discussed imaging findings.  Since this involves the greater trochanter only and the  patient is likely not ambulatory, this would not require operative management. Plan to admit to the medical service for pain control and PT evaluation 7:17 AM D/w Dr Denton Brick He would like to attempt to get patient back to facility since he is already on hospice and will just need pain control We will consult social work try to arrange transport back.  If facility is unable to care for patient, he will be admitted to this hospital Final Clinical Impression(s) / ED Diagnoses Final diagnoses:  Closed fracture of right hip, initial encounter [facility])    Rx / [place] Orders ED Discharge Orders     None        Ripley Fraise, MD 11/16/20 [phone]

## 2020-11-16 NOTE — ED Triage Notes (Signed)
Pt from [place] unit of [facility]. Pt had witness fall (pt attempting to get out of wheelchair) and landing on R hip. Pt denies pain but facility wants checked out.

## 2020-11-16 NOTE — TOC Transition Note (Signed)
Transition of Care [facility]) - CM/SW Discharge Note   Patient Details  Name: Jonathon Miller MRN: [id number] Date of Birth: Apr 07, 1925  Transition of Care [facility]) CM/SW Contact:  Boneta Lucks, RN Phone Number: 11/16/2020, 11:02 AM   Clinical Narrative:   Patient comes in ED after a fall. Patient lives at [facility] active with [facility]. Patient will not require any surgery, needs pain management. Estill Bamberg with [employer] received approved from [place] for patient to discharge back to facility. Nunzio Cory, daughter is agreeable. States patient is already wheelchair bound, she is confirming he will get pain medication.  MD will order STAT pain meds and sent to [place] as requested by [facility]. RN will call report and ED will set up transport. If patient can tolerated wheelchair, [facility] transport wheelchair Lucianne Lei can transport patient back to facility.      Final next level of care: Home w Hospice Care Barriers to Discharge: Barriers Resolved   Patient Goals and CMS Choice Patient states their goals for this hospitalization and ongoing recovery are:: to return to ALF CMS Medicare.gov Compare Post Acute Care list provided to:: Patient Represenative (must comment) Choice offered to / list presented to : Adult Children  Discharge Placement              Patient chooses bed at:  ([facility])   Name of family member notified: [place] updated family Patient and family notified of of transfer: 11/16/20  Discharge Plan and [place] [facility] with [facility]

## 2020-11-16 NOTE — ED Notes (Signed)
Dr Maurene Capes in to see pt.

## 2020-11-16 NOTE — ED Notes (Signed)
Peri-care done.  Multiple well healing scratches and skin tears noted on arms and legs.  Oriented only to name.

## 2020-11-16 NOTE — ED Notes (Signed)
Feed pt dinner. Pt ate 2/3 of food, 1 grape juice, and 1 chocolate pudding.

## 2020-11-16 NOTE — ED Notes (Signed)
Pt assisted with eating breakfast. Pt ate bout 75% of tray.

## 2020-11-16 NOTE — ED Notes (Signed)
Attempted to feed patient. Pt only ate a few bites. Would not eat anymore.

## 2020-11-16 NOTE — ED Notes (Signed)
Pt brief changed.  

## 2020-11-16 NOTE — ED Notes (Signed)
Pt pulling at wires, given busy apron.  Repositioned in bed, safety pads on rails.

## 2020-11-16 NOTE — Consult Note (Addendum)
Patient Demographics:    Jonathon Miller, is a 87 y.o. male  MRN: [id number]   DOB - Oct 04, 1925  Admit Date - 11/16/2020  Outpatient Primary MD for the patient is System, Provider Not In   Assessment & Plan:    Principal Problem:   Rt Greater Trochanter Fx--Hip pain Active Problems:   Alzheimer's dementia without behavioral disturbance (HCC)   Gait disturbance   Falls frequently   Type 2 diabetes mellitus with stage 3 chronic kidney disease, without long-term current use of insulin (HCC)   1)Rt Greater Trochanter Fracture-- Hospice Team to continue to have manage pain and keep patient comfortable at [facility]--- if pain control becomes more challenging hospice will arrange for patient to be transferred to a residential hospice facility for better pain control/pain management The right greater trochanter fracture is non-operative as per orthopedic surgeon----given recurrent falls, safety protocol to discourage ambulation and prevent falls  encouraged  2)Advanced Dementia----at baseline patient has significant memory and cognitive deficits -Continue PTA medication  3)Social/Ethics--- patient's daughter confirms DNR status and hospice involvement prior to presenting here -EDP discussed case with on-call orthopedic surgeon Dr. Amedeo Kinsman--- who stated that this fracture is nonoperative ,advised pain management -Social worker and hospice team worked with patient's facility--arrangements have been made for patient to return back to the facility with hospice helping with pain management--- with plans to transition to residential hospice facility if pain management became more challenging at [place]  4)DM2-blood sugar 168, resume PTA diabetic medications   Disposition-Back to [employer]: The  patient is from: ALF with Hospice              Anticipated d/c is to: ALF with Hospice           With History of - Reviewed by me  Past Medical History:  Diagnosis Date   Alzheimer's dementia ([place])    Anemia of chronic disease 03/18/2017   Cancer ([place])    Skin   Cataract    skin   CRD (chronic renal disease), stage III ([place]) 03/18/2017   Diabetes mellitus without complication (HCC)    Gastritis    Hyperlipidemia    Hypertension    Myocardial infarction [facility])    Thyroid disease       Past Surgical History:  Procedure Laterality Date   APPENDECTOMY     EYE SURGERY      Allergies as of 11/16/2020       Reactions   Penicillins Other (See Comments)   childhood        Medication List     STOP taking these medications    albuterol 108 (90 Base) MCG/ACT inhaler Commonly known as: VENTOLIN HFA   loratadine 10 MG tablet Commonly known as: CLARITIN   pioglitazone 15 MG tablet Commonly known as: ACTOS       TAKE these medications    acetaminophen 325 MG tablet Commonly known as: TYLENOL Take 650  mg by mouth in the morning, at noon, and at bedtime.   amLODipine 2.5 MG tablet Commonly known as: NORVASC Take 2.5 mg by mouth daily.   aspirin EC 81 MG tablet Take 81 mg by mouth daily.   citalopram 20 MG tablet Commonly known as: CELEXA Take 1 tablet (20 mg total) by mouth daily. What changed: how much to take   divalproex 125 MG DR tablet Commonly known as: DEPAKOTE Take 125 mg by mouth daily at 6 (six) AM.   famotidine 20 MG tablet Commonly known as: PEPCID Take 1 tablet (20 mg total) by mouth 2 (two) times daily. What changed: when to take this   haloperidol 5 MG tablet Commonly known as: HALDOL Take 5 mg by mouth every 4 (four) hours as needed for agitation.   insulin detemir 100 UNIT/ML FlexPen Commonly known as: LEVEMIR Inject 10 Units into the skin at bedtime.   levothyroxine 50 MCG tablet Commonly known as: Synthroid Take 1 tablet (50  mcg total) by mouth daily before breakfast.   loperamide 2 MG capsule Commonly known as: IMODIUM Take 2 mg by mouth as needed for diarrhea or loose stools. No more than 8 doses in a 24 hour period.   LORazepam 0.5 MG tablet Commonly known as: ATIVAN Take 1 tablet (0.5 mg total) by mouth 4 (four) times daily as needed. What changed: reasons to take this   melatonin 3 MG Tabs tablet Take 1 tablet by mouth at bedtime as needed for sleep.   morphine 20 MG/ML concentrated solution Commonly known as: ROXANOL Take 0.5 mLs (10 mg total) by mouth every 4 (four) hours as needed for severe pain (Please discpense 60 prefilled syringes). Please discpense 60 prefilled syringes   Muscle Rub 10-15 % Crea Apply 1 application topically at bedtime as needed for pain.   ondansetron 4 MG tablet Commonly known as: ZOFRAN Take 4 mg by mouth every 4 (four) hours as needed for nausea/vomiting.   oxyCODONE 5 MG immediate release tablet Commonly known as: Roxicodone Take 1 tablet (5 mg total) by mouth every 4 (four) hours as needed for severe pain.   QUEtiapine 25 MG tablet Commonly known as: SEROQUEL Take 1 tablet by mouth in the morning and at bedtime.   tamsulosin 0.4 MG Caps capsule Commonly known as: FLOMAX TAKE ONE CAPSULE BY MOUTH EVERY DAY FOR PROSTATE. What changed: See the new instructions.   Tradjenta 5 MG Tabs tablet Generic drug: linagliptin TAKE 1 TABLET BY MOUTH EVERY DAY What changed: how much to take          Chief Complaint  Patient presents with   Fall      HPI:    Jonathon Miller  is a 87 y.o. male with past medical history relevant for advanced dementia with significant cognitive and memory deficits, history of recurrent falls presents from [place] after apparently falling out of a wheelchair and sustaining right hip injury -Patient unable to give any history due to underlying severe dementia -Got history from patient's daughter Ms. Nunzio Cory  [phone] -Apparently patient was already under hospice care at the facility, at baseline patient is not that ambulatory usually gets around with a wheelchair  In ED--chest x-ray without acute findings -CT of the right hip shows--There is an acute, mildly comminuted fracture deformity involving the greater trochanter of the proximal right femur. The fracture fragments are displaced by approximately 7 mm. Fracture line does not extend through the intertrochanteric portions of the proximal right femur.  Lesser trochanter appears intact. Right hip appears located. -CBC with a white count of 10.6 hemoglobin of 11.0 and platelets of 284 -Glucose is 168 creatinine is 1.0 bicarb is 31 potassium is 4.6 with sodium of 137 -COVID-negative - EDP discussed case with on-call orthopedic surgeon Dr. Amedeo Kinsman--- who stated that this fracture is nonoperative ,advised pain management -Social worker and hospice team worked with patient's facility--arrangements have been made for patient to return back to the facility with hospice helping with pain management--- with plans to transition to residential hospice facility if pain management became more challenging at [place] of systems:    In addition to the HPI above,   A full Review of  Systems was done, all other systems reviewed are negative except as noted above in HPI , .   Social History:  Reviewed by me    Social History   Tobacco Use   Smoking status: Never   Smokeless tobacco: Never  Substance Use Topics   Alcohol use: No    Family History :  Reviewed by me    Family History  Problem Relation Age of Onset   Diabetes Other     Home Medications:   Prior to Admission medications   Medication Sig Start Date End Date Taking? Authorizing Provider  acetaminophen (TYLENOL) 325 MG tablet Take 650 mg by mouth in the morning, at noon, and at bedtime.   Yes [provider]  amLODipine (NORVASC) 2.5 MG tablet Take  2.5 mg by mouth daily. 03/28/20  Yes [provider]  aspirin EC 81 MG tablet Take 81 mg by mouth daily.   Yes [provider]  citalopram (CELEXA) 20 MG tablet Take 1 tablet (20 mg total) by mouth daily. Patient taking differently: Take 10 mg by mouth daily. 06/10/17  Yes [name], MD  divalproex (DEPAKOTE) 125 MG DR tablet Take 125 mg by mouth daily at 6 (six) AM. 03/13/20  Yes [provider]  famotidine (PEPCID) 20 MG tablet Take 1 tablet (20 mg total) by mouth 2 (two) times daily. Patient taking differently: Take 20 mg by mouth daily. 05/08/17  Yes Francine Graven, DO  haloperidol (HALDOL) 5 MG tablet Take 5 mg by mouth every 4 (four) hours as needed for agitation. 10/27/20  Yes [provider]  Insulin Detemir (LEVEMIR) 100 UNIT/ML Pen Inject 10 Units into the skin at bedtime.   Yes [provider]  levothyroxine (SYNTHROID) 50 MCG tablet Take 1 tablet (50 mcg total) by mouth right hip injury before breakfast. 06/11/17  Yes Hagler, Apolonio Schneiders, MD  loperamide (IMODIUM) 2 MG capsule Take 2 mg by mouth as needed for diarrhea or loose stools. No more than 8 doses in a 24 hour period.   Yes [provider]  melatonin 3 MG TABS tablet Take 1 tablet by mouth at bedtime as needed for sleep. 03/05/20  Yes [provider]  Menthol-Methyl Salicylate (MUSCLE RUB) 10-15 % CREA Apply 1 application topically at bedtime as needed for pain. 03/19/20  Yes [provider]  morphine (ROXANOL) 20 MG/ML concentrated solution Take 0.5 mLs (10 mg total) by mouth every 4 (four) hours as needed for severe pain. 11/16/20  Yes Marcelle Hepner, MD  ondansetron (ZOFRAN) 4 MG tablet Take 4 mg by mouth every 4 (four) hours as needed for nausea/vomiting. 03/28/20  Yes [provider]  oxyCODONE (ROXICODONE) 5 MG immediate release tablet Take 1 tablet (5 mg total) by mouth every 4 (four) hours as  needed for severe pain. 11/16/20  Yes Nylee Barbuto, MD   QUEtiapine (SEROQUEL) 25 MG tablet Take 1 tablet by mouth in the morning and at bedtime. 03/18/20  Yes [provider]  tamsulosin (FLOMAX) 0.4 MG CAPS capsule TAKE ONE CAPSULE BY MOUTH EVERY DAY FOR PROSTATE. Patient taking differently: Take 0.4 mg by mouth daily. 12/15/18  Yes Gosrani, Nimish C, MD  TRADJENTA 5 MG TABS tablet TAKE 1 TABLET BY MOUTH EVERY DAY Patient taking differently: Take 5 mg by mouth daily. 09/02/17  Yes Hagler, Apolonio Schneiders, MD  LORazepam (ATIVAN) 0.5 MG tablet Take 1 tablet (0.5 mg total) by mouth 4 (four) times daily as needed. 11/16/20   Roxan Hockey, MD     Allergies:     Allergies  Allergen Reactions   Penicillins Other (See Comments)    childhood     Physical Exam:   Vitals  Blood pressure 126/69, pulse 90, temperature 97.9 F (36.6 C), temperature source Oral, resp. rate 18, height 5\' 5"  (1.651 m), weight 73 kg, SpO2 99 %.  Physical Examination: General appearance - alert, pleasantly confused, and in no distress plan Mental status -underlying severe cognitive and memory deficits due to baseline dementia  eyes - sclera anicteric Neck - supple, no JVD elevation , Chest - clear  to auscultation bilaterally, symmetrical air movement,  Heart - S1 and S2 normal, regular  Abdomen - soft, nontender, nondistended,   Neurological -moving extremities well especially arms  extremities - no pedal edema noted, intact peripheral pulses , point tenderness over right hip area Skin - warm, dry, abrasions on hands    Data Review:    CBC Recent Labs  Lab 11/16/20 0553  WBC 10.6*  HGB 9.0*  HCT 28.0*  PLT 284  MCV 93.6  MCH 30.1  MCHC 32.1  RDW 13.8  LYMPHSABS 0.7  MONOABS 0.9  EOSABS 0.1  BASOSABS 0.1   Chemistries  Recent Labs  Lab 11/16/20 0553  NA 137  K 4.6  CL 98  CO2 31  GLUCOSE 168*  BUN 20  CREATININE 1.01  CALCIUM 8.8*    ------------------------------------------------------------------------------------------------------------------ estimated creatinine clearance is 39.7 mL/min (by C-G formula based on SCr of 1.01 mg/dL). ------------------------------------------------------------------------------------------------------------------ No results for input(s): TSH, T4TOTAL, T3FREE, THYROIDAB in the last 72 hours.  Invalid input(s): FREET3   Coagulation profile Recent Labs  Lab 11/16/20 0553  INR 1.0   ------------------------------------------------------------------------------------------------------------------- No results for input(s): DDIMER in the last 72 hours. -------------------------------------------------------------------------------------------------------------------  Cardiac Enzymes No results for input(s): CKMB, TROPONINI, MYOGLOBIN in the last 168 hours.  Invalid input(s): CK ------------------------------------------------------------------------------------------------------------------    Component Value Date/Time   BNP 67.0 05/28/2017 1306   Urinalysis    Component Value Date/Time   COLORURINE YELLOW 01/20/2018 2140   APPEARANCEUR CLEAR 01/20/2018 2140   LABSPEC 1.013 01/20/2018 2140   PHURINE 5.0 01/20/2018 2140   GLUCOSEU >=500 (A) 01/20/2018 2140   HGBUR NEGATIVE 01/20/2018 2140   [place] NEGATIVE 01/20/2018 2140   [place] NEGATIVE 01/20/2018 2140   PROTEINUR NEGATIVE 01/20/2018 2140   NITRITE NEGATIVE 01/20/2018 2140   LEUKOCYTESUR NEGATIVE 01/20/2018 2140    ----------------------------------------------------------------------------------------------------------------   Imaging Results:    DG Pelvis Portable  Result Date: 11/16/2020 CLINICAL DATA:  Status post fall. EXAM: PORTABLE PELVIS 1-2 VIEWS COMPARISON:  None. FINDINGS: A linear lucency is seen extending across the inter trochanteric region of the proximal right femur. There is no evidence  of dislocation. Degenerative changes are seen involving the bilateral hips and visualized portion of the  lower lumbar spine. IMPRESSION: Findings which may represent a nondisplaced inter trochanteric fracture of the proximal right femur. CT correlation is recommended. Electronically Signed   By: Virgina Norfolk M.D.   On: 11/16/2020 04:09   CT Hip Right Wo Contrast  Result Date: 11/16/2020 CLINICAL DATA:  Status post fall EXAM: CT OF THE RIGHT HIP WITHOUT CONTRAST TECHNIQUE: Multidetector CT imaging of the right hip was performed according to the standard protocol. Multiplanar CT image reconstructions were also generated. COMPARISON:  None. FINDINGS: Bones/Joint/Cartilage There is an acute, mildly comminuted fracture deformity involving the greater trochanter of the proximal right femur. The fracture fragments are displaced by approximately 7 mm. Fracture line does not extend through the intertrochanteric portions of the proximal right femur. Lesser trochanter appears intact. Right hip appears located. Ligaments Suboptimally assessed by CT. Muscles and Tendons There is mild edema and soft tissue stranding involving the muscles overlying the greater trochanter. Soft tissues Subcutaneous soft tissue stranding is noted overlying the greater trochanter of the proximal right femur. IMPRESSION: Acute, mildly comminuted fracture deformity involving the greater trochanter of the proximal right femur. Electronically Signed   By: Kerby Moors M.D.   On: 11/16/2020 05:23   DG Chest Port 1 View  Result Date: 11/16/2020 CLINICAL DATA:  87 year old male status post fall with hip fracture. EXAM: PORTABLE CHEST 1 VIEW COMPARISON:  Portable chest 03/30/2020 and earlier. FINDINGS: Portable AP semi upright view at 0544 hours. Improved lung volumes. Calcified aortic atherosclerosis. Other mediastinal contours are within normal limits. Coarse chronic pulmonary interstitial opacity appears stable since 2019. No  pneumothorax, pulmonary edema, pleural effusion or convincing acute pulmonary opacity. Negative visible bowel gas. No acute osseous abnormality identified. IMPRESSION: Chronic interstitial lung changes and Aortic Atherosclerosis (ICD10-I70.0). No acute cardiopulmonary abnormality. Electronically Signed   By: Genevie Ann M.D.   On: 11/16/2020 06:54    Radiological Exams on Admission: DG Pelvis Portable  Result Date: 11/16/2020 CLINICAL DATA:  Status post fall. EXAM: PORTABLE PELVIS 1-2 VIEWS COMPARISON:  None. FINDINGS: A linear lucency is seen extending across the inter trochanteric region of the proximal right femur. There is no evidence of dislocation. Degenerative changes are seen involving the bilateral hips and visualized portion of the lower lumbar spine. IMPRESSION: Findings which may represent a nondisplaced inter trochanteric fracture of the proximal right femur. CT correlation is recommended. Electronically Signed   By: Virgina Norfolk M.D.   On: 11/16/2020 04:09   CT Hip Right Wo Contrast  Result Date: 11/16/2020 CLINICAL DATA:  Status post fall EXAM: CT OF THE RIGHT HIP WITHOUT CONTRAST TECHNIQUE: Multidetector CT imaging of the right hip was performed according to the standard protocol. Multiplanar CT image reconstructions were also generated. COMPARISON:  None. FINDINGS: Bones/Joint/Cartilage There is an acute, mildly comminuted fracture deformity involving the greater trochanter of the proximal right femur. The fracture fragments are displaced by approximately 7 mm. Fracture line does not extend through the intertrochanteric portions of the proximal right femur. Lesser trochanter appears intact. Right hip appears located. Ligaments Suboptimally assessed by CT. Muscles and Tendons There is mild edema and soft tissue stranding involving the muscles overlying the greater trochanter. Soft tissues Subcutaneous soft tissue stranding is noted overlying the greater trochanter of the proximal right  femur. IMPRESSION: Acute, mildly comminuted fracture deformity involving the greater trochanter of the proximal right femur. Electronically Signed   By: Kerby Moors M.D.   On: 11/16/2020 05:23   DG Chest Port 1 View  Result Date: 11/16/2020 CLINICAL  DATA:  87 year old male status post fall with hip fracture. EXAM: PORTABLE CHEST 1 VIEW COMPARISON:  Portable chest 03/30/2020 and earlier. FINDINGS: Portable AP semi upright view at 0544 hours. Improved lung volumes. Calcified aortic atherosclerosis. Other mediastinal contours are within normal limits. Coarse chronic pulmonary interstitial opacity appears stable since 2019. No pneumothorax, pulmonary edema, pleural effusion or convincing acute pulmonary opacity. Negative visible bowel gas. No acute osseous abnormality identified. IMPRESSION: Chronic interstitial lung changes and Aortic Atherosclerosis (ICD10-I70.0). No acute cardiopulmonary abnormality. Electronically Signed   By: Genevie Ann M.D.   On: 11/16/2020 06:54    AM Labs Ordered, also please review Full Orders  Family Communication: Admission, patients condition and plan of care including tests being ordered have been discussed with the patient and  pt's daughter Ms Moshe Cipro at [phone]  who indicate understanding and agree with the plan   Code Status -DNR  Likely DC to  back to [facility] with Hospice  Condition  Stable  Roxan Hockey M.D on 11/16/2020 at 12:20 PM Go to [email or web address] -  for contact info  [facility] - Office  [phone]

## 2020-11-16 NOTE — Assessment & Plan Note (Signed)
There is an acute, mildly comminuted fracture deformity involving the greater trochanter of the proximal right femur. The fracture fragments are displaced by approximately 7 mm. Fracture line does not extend through the intertrochanteric portions of the proximal right femur. Lesser trochanter appears intact. Right hip appears located.

## 2020-11-17 NOTE — ED Notes (Signed)
Brief assessed for incontinence- clean and dry at this time- condom cath replaced- output 900

## 2020-11-26 DEATH — deceased
